# Patient Record
Sex: Male | Born: 1995 | Race: White | Hispanic: No | Marital: Single | Smoking: Never smoker
Health system: Southern US, Community
[De-identification: ages and names within clinical notes are randomized; demographics above are authoritative.]

## PROBLEM LIST (undated history)

## (undated) HISTORY — PX: TONSILECTOMY, ADENOIDECTOMY, BILATERAL MYRINGOTOMY AND TUBES: SHX2538

---
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
## 2013-09-03 ENCOUNTER — Other Ambulatory Visit: Payer: Self-pay | Admitting: Gastroenterology

## 2013-09-04 LAB — WBCS, STOOL

## 2013-09-05 ENCOUNTER — Other Ambulatory Visit: Payer: Self-pay | Admitting: Gastroenterology

## 2013-09-05 LAB — CLOSTRIDIUM DIFFICILE(ARMC)

## 2013-09-06 LAB — WBCS, STOOL

## 2013-09-06 LAB — STOOL CULTURE

## 2013-09-08 LAB — STOOL CULTURE

## 2013-10-08 ENCOUNTER — Ambulatory Visit: Payer: Self-pay | Admitting: Gastroenterology

## 2013-10-11 LAB — PATHOLOGY REPORT

## 2014-11-09 ENCOUNTER — Other Ambulatory Visit: Payer: Self-pay | Admitting: Physician Assistant

## 2014-11-09 DIAGNOSIS — R1084 Generalized abdominal pain: Secondary | ICD-10-CM

## 2014-11-09 DIAGNOSIS — R197 Diarrhea, unspecified: Secondary | ICD-10-CM

## 2014-11-17 ENCOUNTER — Ambulatory Visit
Admission: RE | Admit: 2014-11-17 | Discharge: 2014-11-17 | Disposition: A | Discharge status: Home / Self Care | Payer: 59 | Source: Ambulatory Visit | Attending: Physician Assistant | Admitting: Physician Assistant

## 2014-11-17 DIAGNOSIS — R197 Diarrhea, unspecified: Secondary | ICD-10-CM | POA: Diagnosis present

## 2014-11-17 DIAGNOSIS — R1084 Generalized abdominal pain: Secondary | ICD-10-CM | POA: Insufficient documentation

## 2014-11-17 DIAGNOSIS — M5144 Schmorl's nodes, thoracic region: Secondary | ICD-10-CM | POA: Insufficient documentation

## 2014-11-17 MED ORDER — IOHEXOL 350 MG/ML SOLN
100.0000 mL | Freq: Once | INTRAVENOUS | Status: AC | PRN
Start: 2014-11-17 — End: 2014-11-17
  Administered 2014-11-17: 100 mL via INTRAVENOUS

## 2015-04-29 ENCOUNTER — Encounter: Payer: Self-pay | Admitting: *Deleted

## 2015-04-29 ENCOUNTER — Emergency Department
Admission: EM | Admit: 2015-04-29 | Discharge: 2015-04-29 | Disposition: A | Discharge status: Home / Self Care | Payer: 59 | Attending: Emergency Medicine | Admitting: Emergency Medicine

## 2015-04-29 DIAGNOSIS — H9203 Otalgia, bilateral: Secondary | ICD-10-CM | POA: Diagnosis not present

## 2015-04-29 DIAGNOSIS — R0981 Nasal congestion: Secondary | ICD-10-CM | POA: Diagnosis present

## 2015-04-29 DIAGNOSIS — H6123 Impacted cerumen, bilateral: Secondary | ICD-10-CM | POA: Insufficient documentation

## 2015-04-29 DIAGNOSIS — J0100 Acute maxillary sinusitis, unspecified: Secondary | ICD-10-CM | POA: Diagnosis not present

## 2015-04-29 MED ORDER — AZITHROMYCIN 250 MG PO TABS: ORAL_TABLET | ORAL | Status: DC

## 2015-04-29 NOTE — ED Notes (Signed)
Pt arrived to ED reporting nasal and chest congestion for the past 2 days. Pt reports having a hx of "clogged ear" and mother reports she is unable to see in pts ear canals at this time. Pt reports being unable to hear out of left ear. PT reports green sputum and nasal drainage for the past two days. Pt denies body aches or fevers.

## 2015-04-29 NOTE — ED Provider Notes (Addendum)
[facility] Emergency Department Provider Note ____________________________________________  Time seen: 2325  I have reviewed the triage vital signs and the nursing notes.  HISTORY  Chief Complaint  Nasal Congestion  HPI Derek Gilmore is a 20 y.o. male sensitivity ED with reports of purulent phlegm and postnasal drainage, for the last 2 days. He also reports some decreased hearing in the left ear and some mild otalgia bilaterally. This been no reported fever, chills, sweats. Also been no dizziness, nausea, vomiting.  No past medical history on file.  There are no active problems to display for this patient.   No past surgical history on file.  Current Outpatient Rx  Name  Route  Sig  Dispense  Refill  . azithromycin (ZITHROMAX Z-PAK) 250 MG tablet      Take 2 tablets (500 mg) on  Day 1,  followed by 1 tablet (250 mg) once daily on Days 2 through 5.   6 each   0    Allergies Review of patient's allergies indicates no known allergies.  No family history on file.  Social History Social History  Substance Use Topics  . Smoking status: Never Smoker   . Smokeless tobacco: Not on file  . Alcohol Use: No    Review of Systems  Constitutional: Negative for fever. Eyes: Negative for visual changes. ENT: Negative for sore throat. Sinus congestion and otalgia bilaterally. Cardiovascular: Negative for chest pain. Respiratory: Negative for shortness of breath. Gastrointestinal: Negative for abdominal pain, vomiting and diarrhea. Skin: Negative for rash. Neurological: Negative for headaches, focal weakness or numbness. ____________________________________________  PHYSICAL EXAM:  VITAL SIGNS: ED Triage Vitals  Enc Vitals Group     BP 04/29/15 2148 135/76 mmHg     Pulse Rate 04/29/15 2148 70     Resp 04/29/15 2148 16     Temp 04/29/15 2148 98.9 F (37.2 C)     Temp Source 04/29/15 2148 Oral     SpO2 04/29/15 2148 96 %     Weight 04/29/15 2148  240 lb (108.863 kg)     Height 04/29/15 2148  (1.753 m)     Head Cir --      Peak Flow --      Pain Score 04/29/15 2237 0     Pain Loc --      Pain Edu? --      Excl. in GC? --    Constitutional: Alert and oriented. Well appearing and in no distress. Head: Normocephalic and atraumatic. Positive maxillary facial sinus tenderness.      Eyes: Conjunctivae are normal. PERRL. Normal extraocular movements      Ears: Canals clear. TMs intact obscured bilaterally by copious amounts of soft cerumen nearly completely occluding the ear canals.   Nose: No congestion/rhinorrhea.   Mouth/Throat: Mucous membranes are moist.   Neck: Supple. No thyromegaly. Hematological/Lymphatic/Immunological: No cervical lymphadenopathy. Cardiovascular: Normal rate, regular rhythm.  Respiratory: Normal respiratory effort. No wheezes/rales/rhonchi. Musculoskeletal: Nontender with normal range of motion in all extremities.  Neurologic:  Normal gait without ataxia. Normal speech and language. No gross focal neurologic deficits are appreciated. Skin:  Skin is warm, dry and intact. No rash noted. Psychiatric: Mood and affect are normal. Patient exhibits appropriate insight and judgment. ____________________________________________  PROCEDURES  Ceruminosis is noted bilaterally.   Verbal consent obtained. Patient prepped and draped. Each ear canal is flushed with a 1:1 mixture of hydrogen peroxide : warm water Using a 30 cc syringe + 18 G IV cannula  Moderately-sized plug of cerumen is flushed by ear canal. TMs visualized and intact bilaterally.  Patient tolerated the procedure without complication. ____________________________________________  INITIAL IMPRESSION / ASSESSMENT AND PLAN / ED COURSE  Patient with acute otalgia bilaterally secondary to cerumen impaction. He is also being treated for an acute sinusitis. He'll dose the azithromycin as directed and follow with his primary care provider for  ongoing symptom management. ____________________________________________  FINAL CLINICAL IMPRESSION(S) / ED DIAGNOSES  Final diagnoses:  Otalgia of both ears  Cerumen impaction, bilateral  Acute maxillary sinusitis, recurrence not specified      Lissa Hoard, PA-C 04/29/15 2348  Phineas Semen, MD 04/30/15 1521  Lissa Hoard, PA-C 05/16/15 0019  Phineas Semen, MD 05/16/15 [phone]

## 2015-04-29 NOTE — Discharge Instructions (Signed)
Cerumen Impaction The structures of the external ear canal secrete a waxy substance known as cerumen. Excess cerumen can build up in the ear canal, causing a condition known as cerumen impaction. Cerumen impaction can cause ear pain and disrupt the function of the ear. The rate of cerumen production differs for each individual. In certain individuals, the configuration of the ear canal may decrease his or her ability to naturally remove cerumen. CAUSES Cerumen impaction is caused by excessive cerumen production or buildup. RISK FACTORS  Frequent use of swabs to clean ears.  Having narrow ear canals.  Having eczema.  Being dehydrated. SIGNS AND SYMPTOMS  Diminished hearing.  Ear drainage.  Ear pain.  Ear itch. TREATMENT Treatment may involve:  Over-the-counter or prescription ear drops to soften the cerumen.  Removal of cerumen by a health care provider. This may be done with:  Irrigation with warm water. This is the most common method of removal.  Ear curettes and other instruments.  Surgery. This may be done in severe cases. HOME CARE INSTRUCTIONS  Take medicines only as directed by your health care provider.  Do not insert objects into the ear with the intent of cleaning the ear. PREVENTION  Do not insert objects into the ear, even with the intent of cleaning the ear. Removing cerumen as a part of normal hygiene is not necessary, and the use of swabs in the ear canal is not recommended.  Drink enough water to keep your urine clear or pale yellow.  Control your eczema if you have it. SEEK MEDICAL CARE IF:  You develop ear pain.  You develop bleeding from the ear.  The cerumen does not clear after you use ear drops as directed.   This information is not intended to replace advice given to you by your health care provider. Make sure you discuss any questions you have with your health care provider.   Document Released: 04/04/2004 Document Revised: 03/18/2014  Document Reviewed: 10/12/2014 Elsevier Interactive Patient Education 2016 [employer]  Sinusitis, Adult Sinusitis is redness, soreness, and puffiness (inflammation) of the air pockets in the bones of your face (sinuses). The redness, soreness, and puffiness can cause air and mucus to get trapped in your sinuses. This can allow germs to grow and cause an infection.  HOME CARE   Drink enough fluids to keep your pee (urine) clear or pale yellow.  Use a humidifier in your home.  Run a hot shower to create steam in the bathroom. Sit in the bathroom with the door closed. Breathe in the steam 3-4 times a day.  Put a warm, moist washcloth on your face 3-4 times a day, or as told by your doctor.  Use salt water sprays (saline sprays) to wet the thick fluid in your nose. This can help the sinuses drain.  Only take medicine as told by your doctor. GET HELP RIGHT AWAY IF:   Your pain gets worse.  You have very bad headaches.  You are sick to your stomach (nauseous).  You throw up (vomit).  You are very sleepy (drowsy) all the time.  Your face is puffy (swollen).  Your vision changes.  You have a stiff neck.  You have trouble breathing. MAKE SURE YOU:   Understand these instructions.  Will watch your condition.  Will get help right away if you are not doing well or get worse.   This information is not intended to replace advice given to you by your health care provider. Make sure you  discuss any questions you have with your health care provider.   Document Released: 08/14/2007 Document Revised: 03/18/2014 Document Reviewed: 10/01/2011 Elsevier Interactive Patient Education [employer]  Take the antibiotic as directed. Follow-up with Dr. Judithann Sheen as needed.

## 2015-04-29 NOTE — ED Notes (Addendum)
Pt reports congestion, cough (green phlegm), green nasal drainage for the last 2 days - Pt reports hard to hear out of left ear - Denies fever - Denies body aches or chills - Denies vomiting or diarrhea

## 2015-06-01 ENCOUNTER — Encounter: Payer: Self-pay | Admitting: Emergency Medicine

## 2015-06-01 ENCOUNTER — Emergency Department
Admission: EM | Admit: 2015-06-01 | Discharge: 2015-06-01 | Disposition: A | Discharge status: Home / Self Care | Payer: 59 | Attending: Emergency Medicine | Admitting: Emergency Medicine

## 2015-06-01 DIAGNOSIS — T1501XA Foreign body in cornea, right eye, initial encounter: Secondary | ICD-10-CM | POA: Diagnosis not present

## 2015-06-01 DIAGNOSIS — H209 Unspecified iridocyclitis: Secondary | ICD-10-CM | POA: Insufficient documentation

## 2015-06-01 DIAGNOSIS — H5711 Ocular pain, right eye: Secondary | ICD-10-CM | POA: Diagnosis present

## 2015-06-01 MED ORDER — FLUORESCEIN SODIUM 1 MG OP STRP
ORAL_STRIP | OPHTHALMIC | Status: AC
  Filled 2015-06-01: qty 1

## 2015-06-01 NOTE — Discharge Instructions (Signed)
Uveitis °Uveitis is swelling and irritation (inflammation) in the eye. It often affects the middle part of the eye (uvea). This area contains many of the blood vessels that supply the rest of the eye. The uvea is made up of three structures: °· The middle layer of the eyeball (choroid). °· The colored part of the front of the eye (iris). °· The connection between the iris and the choroid (ciliary body). °Uveitis can affect any part of the uvea as well as other important structures in the eye. There are many types of uveitis: °· Iritis, or anterior uveitis, affects the iris. This is the most common type. It can start suddenly and can last for many weeks. °· Intermediate uveitis affects the structures in the middle of the eye. This includes the fluid that fills the eye (vitreous). This type can last for years and can come and go. °· Posterior uveitis affects the structures in the back of the eye. This includes the light-sensitive layer of cells that is needed for vision (retina). This is the least common type. °· Panuveitis affects all layers of the eye. °Uveitis can affect one eye or both eyes. It can cause short-term or long-term symptoms. Symptoms may go away and come back. Over time, the condition can damage or destroy eye structures and can lead to vision loss. °CAUSES °This condition may be caused by: °· Infections that start in the eye or spread to the eye. °· Inflammatory diseases that can affect the eye. °· Autoimmune diseases. These are diseases in which the body's defense system (immune system) mistakenly attacks the body's own tissues. °· Eye injuries. °In some cases, the cause may not be known. °RISK FACTORS °This condition is more likely to develop in people who are 20-60 years old. °SYMPTOMS °Symptoms of this condition depend on the type of uveitis. Common symptoms include: °· Eye redness. °· Eye pain. °· Blurred vision. °· Decreased vision. °· Floating dark spots in your vision  (floaters). °· Sensitivity to light. °· A white spot in the lower part of the iris (hypopyon). °DIAGNOSIS °This condition is usually diagnosed by an eye specialist (ophthalmologist). The ophthalmologist will do a complete eye exam. This exam may include: °· A vision test using eye charts. °· An exam that involves using a scope for viewing inside the eye (ophthalmoscope or slit lamp). Eye drops may be used to widen (dilate) your pupil to make it easier to see inside your eye. °· A test to measure eye pressure. °You may have other medical tests to help determine the cause of your uveitis. °TREATMENT °Treatment for this condition may depend on the type of uveitis that you have. It should be started right away to help prevent vision loss. Treatment may include: °· Medicine to block inflammation (steroids). You may get steroids: °¨ As eye drops. °¨ As an injection into your eye. °¨ By mouth. °· Eye drops to dilate the pupil and reduce pressure inside the eye. °· In some cases, medicines may be given through an IV tube or through a device that is implanted inside the eye. °HOME CARE INSTRUCTIONS °· Take medicines only as directed by your health care provider. Use eye drops exactly as directed. °· Follow instructions from your health care provider about any restrictions on your activities. Ask what activities are safe for you. °· Do not use any tobacco products, including cigarettes, chewing tobacco, or electronic cigarettes. If you need help quitting, ask your health care provider. °· Keep all follow-up visits   as directed by your health care provider. This is important. °SEEK MEDICAL CARE IF: °· Your medicines are not working. °SEEK IMMEDIATE MEDICAL CARE IF: °· You have redness in one eye or both eyes. °· Your eyes are very sensitive to light. °· You have pain or aching in either eye. °· You have vision loss in either eye. °  °This information is not intended to replace advice given to you by your health care provider.  Make sure you discuss any questions you have with your health care provider. °  °Document Released: 05/24/2008 Document Revised: 07/12/2014 Document Reviewed: 03/02/2014 °Elsevier Interactive Patient Education ©2016 Elsevier Inc. ° °

## 2015-06-01 NOTE — ED Notes (Signed)
See triage  States he was painting with a sprayer and it had gotten in face.  eye is red and sl swollen

## 2015-06-01 NOTE — ED Notes (Addendum)
Pt to ed with c/o pain in right eye that started 2 days ago.  Eye is red and swollen this am. Pt states about 2 days ago he was painting and the paint sprayed in his face and eyes.

## 2015-06-01 NOTE — ED Provider Notes (Signed)
[facility] Emergency Department Provider Note  ____________________________________________    I have reviewed the triage vital signs and the nursing notes.   HISTORY  Chief Complaint Eye Pain    HPI Derek Gilmore is a 20 y.o. male who presents with complaints of right eye pain. Patient reports 2 days ago he was working with a paint gun which malfunctioned and showered him with paint all over the side of his body. He reports none got in his eye however, he reports he did not have any eye pain at the time. Today he reports his eye is red and tearing and painful especially to light.     History reviewed. No pertinent past medical history.  There are no active problems to display for this patient.   History reviewed. No pertinent past surgical history.  Current Outpatient Rx  Name  Route  Sig  Dispense  Refill  . azithromycin (ZITHROMAX Z-PAK) 250 MG tablet      Take 2 tablets (500 mg) on  Day 1,  followed by 1 tablet (250 mg) once daily on Days 2 through 5.   6 each   0     Allergies Review of patient's allergies indicates no known allergies.  History reviewed. No pertinent family history.  Social History Social History  Substance Use Topics  . Smoking status: Never Smoker   . Smokeless tobacco: None  . Alcohol Use: No    Review of Systems  Constitutional: Negative for fever. Eyes: Positive for redness and sensitivity to light ENT: Negative for sore throat Skin: Negative for rash.  Psychiatric: no anxiety    ____________________________________________   PHYSICAL EXAM:  VITAL SIGNS: ED Triage Vitals  Enc Vitals Group     BP 06/01/15 1242 152/77 mmHg     Pulse Rate 06/01/15 1242 100     Resp 06/01/15 1242 18     Temp 06/01/15 1242 98.3 F (36.8 C)     Temp Source 06/01/15 1242 Oral     SpO2 06/01/15 1242 100 %     Weight 06/01/15 1242 250 lb (113.399 kg)     Height 06/01/15 1242  (1.702 m)     Head Cir --    Peak Flow --      Pain Score 06/01/15 1251 5     Pain Loc --      Pain Edu? --      Excl. in GC? --      Constitutional: Alert and oriented. Well appearing and in no distress.  Eyes: Conjunctivae are injected. Patient develops pain when pen light shined in the affected eye and the unaffected eye. Does appear to be red rim surrounding the iris. No purulent discharge. Mild erythema of the orbit possibly related to patient rubbing his eye, no hyphema.Pinpoint area of fluorescein uptake at 6 PM right eye  ENT   Head: Normocephalic and atraumatic.   Mouth/Throat: Mucous membranes are moist.  Neurologic:  Normal speech and language. No gross focal neurologic deficits are appreciated. Skin:  Skin is warm, dry and intact. No rash noted. Psychiatric: Mood and affect are normal. Patient exhibits appropriate insight and judgment.  ____________________________________________    LABS (pertinent positives/negatives)  Labs Reviewed - No data to display  ____________________________________________   EKG  None  ____________________________________________    RADIOLOGY  None  ____________________________________________   PROCEDURES  Procedure(s) performed: none  Critical Care performed: none  ____________________________________________   INITIAL IMPRESSION / ASSESSMENT AND PLAN / ED COURSE  Pertinent labs & imaging results that were available during my care of the patient were reviewed by me and considered in my medical decision making (see chart for details).  Concerning for iritis. I will discuss with ophthalmology   Dr. Georgianne FickVin will see the patient in the office in 20 minutes ____________________________________________   FINAL CLINICAL IMPRESSION(S) / ED DIAGNOSES  Final diagnoses:  Iritis          Jene Everyobert Ardie Dragoo, MD 06/01/15 1432

## 2015-06-02 DIAGNOSIS — T1501XD Foreign body in cornea, right eye, subsequent encounter: Secondary | ICD-10-CM | POA: Diagnosis not present

## 2015-12-04 ENCOUNTER — Encounter: Payer: Self-pay | Admitting: Emergency Medicine

## 2015-12-04 ENCOUNTER — Emergency Department
Admission: EM | Admit: 2015-12-04 | Discharge: 2015-12-05 | Disposition: A | Discharge status: Home / Self Care | Payer: 59 | Attending: Emergency Medicine | Admitting: Emergency Medicine

## 2015-12-04 DIAGNOSIS — M5412 Radiculopathy, cervical region: Secondary | ICD-10-CM | POA: Diagnosis not present

## 2015-12-04 DIAGNOSIS — Z792 Long term (current) use of antibiotics: Secondary | ICD-10-CM | POA: Diagnosis not present

## 2015-12-04 DIAGNOSIS — M5442 Lumbago with sciatica, left side: Secondary | ICD-10-CM | POA: Diagnosis not present

## 2015-12-04 DIAGNOSIS — M5432 Sciatica, left side: Secondary | ICD-10-CM

## 2015-12-04 DIAGNOSIS — M545 Low back pain, unspecified: Secondary | ICD-10-CM

## 2015-12-04 DIAGNOSIS — M9903 Segmental and somatic dysfunction of lumbar region: Secondary | ICD-10-CM | POA: Diagnosis not present

## 2015-12-04 DIAGNOSIS — M542 Cervicalgia: Secondary | ICD-10-CM | POA: Diagnosis present

## 2015-12-04 NOTE — ED Triage Notes (Signed)
Patient ambulatory to triage with steady gait, without difficulty or distress noted; pt reports had back adjusted at 3pm and now with pain to lower back and lower legs with HA to "temples"

## 2015-12-05 ENCOUNTER — Emergency Department: Payer: 59

## 2015-12-05 DIAGNOSIS — M5412 Radiculopathy, cervical region: Secondary | ICD-10-CM | POA: Diagnosis not present

## 2015-12-05 DIAGNOSIS — Z792 Long term (current) use of antibiotics: Secondary | ICD-10-CM | POA: Diagnosis not present

## 2015-12-05 DIAGNOSIS — M542 Cervicalgia: Secondary | ICD-10-CM | POA: Diagnosis not present

## 2015-12-05 DIAGNOSIS — M5432 Sciatica, left side: Secondary | ICD-10-CM | POA: Diagnosis not present

## 2015-12-05 DIAGNOSIS — M545 Low back pain: Secondary | ICD-10-CM | POA: Diagnosis not present

## 2015-12-05 MED ORDER — CYCLOBENZAPRINE HCL 10 MG PO TABS
10.0000 mg | ORAL_TABLET | Freq: Once | ORAL | Status: AC
  Administered 2015-12-05: 10 mg via ORAL
  Filled 2015-12-05: qty 1

## 2015-12-05 MED ORDER — CYCLOBENZAPRINE HCL 10 MG PO TABS: 10.0000 mg | ORAL_TABLET | Freq: Three times a day (TID) | ORAL | 0 refills | Status: DC | PRN

## 2015-12-05 NOTE — ED Notes (Signed)
Pt asleep in bed, call light in reach

## 2015-12-07 DIAGNOSIS — S134XXA Sprain of ligaments of cervical spine, initial encounter: Secondary | ICD-10-CM | POA: Diagnosis not present

## 2015-12-07 DIAGNOSIS — M5442 Lumbago with sciatica, left side: Secondary | ICD-10-CM | POA: Diagnosis not present

## 2015-12-08 NOTE — ED Provider Notes (Signed)
[facility] Emergency Department Provider Note    First MD Initiated Contact with Patient 12/05/15 0100     (approximate)  I have reviewed the triage vital signs and the nursing notes.   HISTORY  Chief Complaint Back Pain   HPI Derek Gilmore is a 20 y.o. male presents with low neck and low back pain following chiropractic manipulation at 3 PM yesterday afternoon. Patient states that the pain in his back radiates down the posterior portion of his left leg Patient also admits to headache to the bilateral "temples". Patient denies any weakness no numbness or gait instability. Patient denies any nausea or vomiting. Patient denies any visual changes   Past medical history "Back problems" There are no active problems to display for this patient.   Past surgical history None  Prior to Admission medications   Medication Sig Start Date End Date Taking? Authorizing Provider  azithromycin (ZITHROMAX Z-PAK) 250 MG tablet Take 2 tablets (500 mg) on  Day 1,  followed by 1 tablet (250 mg) once daily on Days 2 through 5. 04/29/15   Jenise V Bacon Menshew, PA-C  cyclobenzaprine (FLEXERIL) 10 MG tablet Take 1 tablet (10 mg total) by mouth 3 (three) times daily as needed. 12/05/15   Darci Currentandolph N Brown, MD    Allergies Review of patient's allergies indicates no known allergies.  No family history on file.  Social History Social History  Substance Use Topics  . Smoking status: Never Smoker  . Smokeless tobacco: Never Used  . Alcohol use No    Review of Systems Constitutional: No fever/chills Eyes: No visual changes. ENT: No sore throat. Cardiovascular: Denies chest pain. Respiratory: Denies shortness of breath. Gastrointestinal: No abdominal pain.  No nausea, no vomiting.  No diarrhea.  No constipation. Genitourinary: Negative for dysuria. Musculoskeletal:Positive for neck and back pain. Skin: Negative for rash. Neurological: Negative for headaches, focal  weakness or numbness.  10-point ROS otherwise negative.  ____________________________________________   PHYSICAL EXAM:  VITAL SIGNS: ED Triage Vitals  Enc Vitals Group     BP 12/04/15 2257 (!) 157/89     Pulse Rate 12/04/15 2257 85     Resp 12/04/15 2257 18     Temp 12/04/15 2257 98 F (36.7 C)     Temp Source 12/04/15 2257 Oral     SpO2 12/04/15 2257 99 %     Weight 12/04/15 2258 270 lb (122.5 kg)     Height 12/04/15 2258 5\' 9"  (1.753 m)     Head Circumference --      Peak Flow --      Pain Score 12/04/15 2258 5     Pain Loc --      Pain Edu? --      Excl. in GC? --     Constitutional: Alert and oriented. Well appearing and in no acute distress. Eyes: Conjunctivae are normal. PERRL. EOMI. Head: Atraumatic. Ears:  Healthy appearing ear canals and TMs bilaterally Nose: No congestion/rhinnorhea. Mouth/Throat: Mucous membranes are moist.  Oropharynx non-erythematous. Neck: No stridor.  No meningeal signs.  Pain with palpation C5-C7 Cardiovascular: Normal rate, regular rhythm. Good peripheral circulation. Grossly normal heart sounds. Respiratory: Normal respiratory effort.  No retractions. Lungs CTAB. Gastrointestinal: Soft and nontender. No distention.  Musculoskeletal: No lower extremity tenderness nor edema. No gross deformities of extremities. Pain with palpation L3-L5 Neurologic:  Normal speech and language. No gross focal neurologic deficits are appreciated.  Skin:  Skin is warm, dry and intact. No rash  noted. Psychiatric: Mood and affect are normal. Speech and behavior are normal.   RADIOLOGY I, Thatcher N BROWN, personally viewed and evaluated these images (plain radiographs) as part of my medical decision making, as well as reviewing the written report by the radiologist.  CLINICAL DATA:  Neck and back pain after adjustment this afternoon. Headache radiating to the temples.  EXAM: MRI CERVICAL AND LUMBAR SPINE WITHOUT CONTRAST  TECHNIQUE: Multiplanar and  multiecho pulse sequences of the cervical spine, to include the craniocervical junction and cervicothoracic junction, and lumbar spine, were obtained without intravenous contrast.  COMPARISON:  CT abdomen and pelvis November 17, 2014  FINDINGS: MRI CERVICAL SPINE FINDINGS- Mild motion degraded examination.  ALIGNMENT: Straightened cervical lordosis.  No malalignment.  VERTEBRAE/DISCS: Vertebral bodies are intact. Intervertebral disc morphology's and signal are normal.  CORD:Cervical spinal cord is normal morphology and signal characteristics from the cervicomedullary junction to level of T1-2, the most caudal well visualized level.  POSTERIOR FOSSA, VERTEBRAL ARTERIES, PARASPINAL TISSUES: No MR findings of ligamentous injury. Vertebral artery flow voids present. Included posterior fossa and paraspinal soft tissues are normal.  DISC LEVELS:  C2-3 through C5-6: No disc bulge, canal stenosis nor neural foraminal narrowing.  C6-7: Tiny central disc protrusion and annular fissure. No canal stenosis or neural foraminal narrowing.  C7-T1: No disc bulge, canal stenosis nor neural foraminal narrowing.  MRI LUMBAR SPINE FINDINGS  SEGMENTATION: For the purposes of this report, the last well-formed intervertebral disc will be described as L5-S1.  ALIGNMENT: No malalignment.  Maintenance of the lumbar lordosis.  VERTEBRAE:Lumbar vertebral bodies are intact. Bilateral chronic L5 pars interarticularis defects. Intervertebral discs demonstrate normal morphology. Decreased T2 signal within the L5-S1 disc compatible with mild desiccation. Mild acute discogenic endplate changes on the LEFT at L5-S1.  CONUS MEDULLARIS: Conus medullaris terminates at L1-2 and demonstrates normal morphology and signal characteristics. Cauda equina is normal.  PARASPINAL AND SOFT TISSUES: Included prevertebral and paraspinal soft tissues are normal.  DISC LEVELS:  T12-L1 through L4-5:  No disc bulge, canal stenosis nor neural foraminal narrowing.  L5-S1: Tiny LEFT subarticular disc protrusion versus extrusion, difficult to characterize on axial sequences due to small size. No canal stenosis. Mild to moderate LEFT neural foraminal narrowing. The exited LEFT L5 nerve appears somewhat bulky and T2 bright.  IMPRESSION: MRI CERVICAL SPINE: Straightened cervical lordosis without acute fracture or malalignment.  Tiny central disc protrusion and annular fissure C6-7. No canal stenosis or neural foraminal narrowing.  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  Small LEFT L5-S1 subarticular disc protrusion versus extrusion and suspected LEFT L5 neuritis.  No canal stenosis. Mild to moderate LEFT L5-S1 neural foraminal narrowing.   Electronically Signed   By: Awilda Metro M.D.   On: 12/05/2015 02:33  Procedures      INITIAL IMPRESSION / ASSESSMENT AND PLAN / ED COURSE  Pertinent labs & imaging results that were available during my care of the patient were reviewed by me and considered in my medical decision making (see chart for details).  History physical exam consistent with cervical radiculopathy possible sciatica as such patient given Flexeril emergency department will be prescribed same for home. Patient referred to orthopedic surgeon   Clinical Course    ____________________________________________  FINAL CLINICAL IMPRESSION(S) / ED DIAGNOSES  Final diagnoses:  Lumbar spine pain  Cervical radiculopathy  Sciatica of left side     MEDICATIONS GIVEN DURING THIS VISIT:  Medications  cyclobenzaprine (FLEXERIL) tablet 10  mg (10 mg Oral Given 12/05/15 0313)     NEW OUTPATIENT MEDICATIONS STARTED DURING THIS VISIT:  Discharge Medication List as of 12/05/2015  3:21 AM    START taking these medications   Details  cyclobenzaprine (FLEXERIL) 10 MG tablet  Take 1 tablet (10 mg total) by mouth 3 (three) times daily as needed., Starting Tue 12/05/2015, Print        Discharge Medication List as of 12/05/2015  3:21 AM      Discharge Medication List as of 12/05/2015  3:21 AM       Note:  This document was prepared using Dragon voice recognition software and may include unintentional dictation errors.    Darci Current, MD 12/08/15 [phone]

## 2016-03-18 DIAGNOSIS — Z13220 Encounter for screening for lipoid disorders: Secondary | ICD-10-CM | POA: Diagnosis not present

## 2016-03-18 DIAGNOSIS — Z79899 Other long term (current) drug therapy: Secondary | ICD-10-CM | POA: Diagnosis not present

## 2016-03-18 DIAGNOSIS — Z1329 Encounter for screening for other suspected endocrine disorder: Secondary | ICD-10-CM | POA: Diagnosis not present

## 2016-03-18 DIAGNOSIS — Z0000 Encounter for general adult medical examination without abnormal findings: Secondary | ICD-10-CM | POA: Diagnosis not present

## 2016-03-18 DIAGNOSIS — R030 Elevated blood-pressure reading, without diagnosis of hypertension: Secondary | ICD-10-CM | POA: Diagnosis not present

## 2016-07-25 DIAGNOSIS — Z6841 Body Mass Index (BMI) 40.0 and over, adult: Secondary | ICD-10-CM | POA: Diagnosis not present

## 2016-07-25 DIAGNOSIS — J4520 Mild intermittent asthma, uncomplicated: Secondary | ICD-10-CM | POA: Diagnosis not present

## 2017-01-08 DIAGNOSIS — R1084 Generalized abdominal pain: Secondary | ICD-10-CM | POA: Diagnosis not present

## 2017-01-08 DIAGNOSIS — R252 Cramp and spasm: Secondary | ICD-10-CM | POA: Diagnosis not present

## 2017-01-09 ENCOUNTER — Other Ambulatory Visit: Payer: Self-pay | Admitting: Internal Medicine

## 2017-01-09 DIAGNOSIS — R1084 Generalized abdominal pain: Secondary | ICD-10-CM

## 2017-12-12 DIAGNOSIS — E7800 Pure hypercholesterolemia, unspecified: Secondary | ICD-10-CM | POA: Diagnosis not present

## 2017-12-12 DIAGNOSIS — Z0000 Encounter for general adult medical examination without abnormal findings: Secondary | ICD-10-CM | POA: Diagnosis not present

## 2018-01-16 DIAGNOSIS — E7800 Pure hypercholesterolemia, unspecified: Secondary | ICD-10-CM | POA: Diagnosis not present

## 2018-01-16 DIAGNOSIS — Z0000 Encounter for general adult medical examination without abnormal findings: Secondary | ICD-10-CM | POA: Diagnosis not present

## 2018-08-22 ENCOUNTER — Emergency Department: Payer: 59

## 2018-08-22 ENCOUNTER — Emergency Department
Admission: EM | Admit: 2018-08-22 | Discharge: 2018-08-22 | Disposition: A | Discharge status: Home / Self Care | Payer: 59 | Attending: Emergency Medicine | Admitting: Emergency Medicine

## 2018-08-22 ENCOUNTER — Other Ambulatory Visit: Payer: Self-pay

## 2018-08-22 ENCOUNTER — Encounter: Payer: Self-pay | Admitting: Emergency Medicine

## 2018-08-22 DIAGNOSIS — Y93I9 Activity, other involving external motion: Secondary | ICD-10-CM | POA: Diagnosis not present

## 2018-08-22 DIAGNOSIS — S30811A Abrasion of abdominal wall, initial encounter: Secondary | ICD-10-CM | POA: Insufficient documentation

## 2018-08-22 DIAGNOSIS — Y998 Other external cause status: Secondary | ICD-10-CM | POA: Insufficient documentation

## 2018-08-22 DIAGNOSIS — S40812A Abrasion of left upper arm, initial encounter: Secondary | ICD-10-CM | POA: Diagnosis not present

## 2018-08-22 DIAGNOSIS — Y9289 Other specified places as the place of occurrence of the external cause: Secondary | ICD-10-CM | POA: Diagnosis not present

## 2018-08-22 DIAGNOSIS — S60221A Contusion of right hand, initial encounter: Secondary | ICD-10-CM | POA: Insufficient documentation

## 2018-08-22 DIAGNOSIS — S40811A Abrasion of right upper arm, initial encounter: Secondary | ICD-10-CM | POA: Diagnosis not present

## 2018-08-22 DIAGNOSIS — S60512A Abrasion of left hand, initial encounter: Secondary | ICD-10-CM | POA: Insufficient documentation

## 2018-08-22 DIAGNOSIS — S60511A Abrasion of right hand, initial encounter: Secondary | ICD-10-CM | POA: Diagnosis not present

## 2018-08-22 DIAGNOSIS — T07XXXA Unspecified multiple injuries, initial encounter: Secondary | ICD-10-CM

## 2018-08-22 MED ORDER — SILVER SULFADIAZINE 1 % EX CREA: TOPICAL_CREAM | CUTANEOUS | 1 refills | Status: DC

## 2018-08-22 MED ORDER — HYDROCODONE-ACETAMINOPHEN 5-325 MG PO TABS: 1.0000 | ORAL_TABLET | Freq: Four times a day (QID) | ORAL | 0 refills | Status: DC | PRN

## 2018-08-22 MED ORDER — TETANUS-DIPHTH-ACELL PERTUSSIS 5-2.5-18.5 LF-MCG/0.5 IM SUSP
0.5000 mL | Freq: Once | INTRAMUSCULAR | Status: AC
  Administered 2018-08-22: 0.5 mL via INTRAMUSCULAR
  Filled 2018-08-22: qty 0.5

## 2018-08-22 MED ORDER — SILVER SULFADIAZINE 1 % EX CREA
TOPICAL_CREAM | Freq: Once | CUTANEOUS | Status: AC
  Administered 2018-08-22: 15:00:00 via TOPICAL
  Filled 2018-08-22: qty 85

## 2018-08-22 MED ORDER — CEPHALEXIN 500 MG PO CAPS: 500.0000 mg | ORAL_CAPSULE | Freq: Three times a day (TID) | ORAL | 0 refills | Status: DC
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
## 2018-12-14 ENCOUNTER — Ambulatory Visit: Payer: Self-pay

## 2018-12-14 ENCOUNTER — Other Ambulatory Visit: Payer: Self-pay

## 2018-12-14 VITALS — BP 140/82 | HR 76 | Resp 18 | Ht 69.0 in | Wt 278.0 lb

## 2018-12-14 DIAGNOSIS — Z008 Encounter for other general examination: Secondary | ICD-10-CM

## 2018-12-14 LAB — POCT LIPID PANEL
HDL: 41
LDL: 61
Non-HDL: 109
POC Glucose: 99 mg/dl (ref 70–99)
TC/HDL: 3.6
TC: 150
TRG: 237

## 2018-12-14 NOTE — Progress Notes (Signed)
     Patient ID: Derek Gilmore, male    DOB: Aug 21, 1995, 23 y.o.   MRN: [id number]    Thank you!!  Apolonio Schneiders RN  [place] Nurse Specialist [place]: [phone]  Cell:  [phone] Website: [email or web address]

## 2019-01-07 DIAGNOSIS — K589 Irritable bowel syndrome without diarrhea: Secondary | ICD-10-CM | POA: Diagnosis not present

## 2019-01-07 DIAGNOSIS — Z0000 Encounter for general adult medical examination without abnormal findings: Secondary | ICD-10-CM | POA: Diagnosis not present

## 2019-01-07 DIAGNOSIS — E7800 Pure hypercholesterolemia, unspecified: Secondary | ICD-10-CM | POA: Diagnosis not present

## 2019-01-07 DIAGNOSIS — Z6841 Body Mass Index (BMI) 40.0 and over, adult: Secondary | ICD-10-CM | POA: Insufficient documentation

## 2019-01-07 DIAGNOSIS — J4520 Mild intermittent asthma, uncomplicated: Secondary | ICD-10-CM | POA: Diagnosis not present

## 2019-02-01 ENCOUNTER — Emergency Department: Payer: 59

## 2019-02-01 ENCOUNTER — Encounter: Payer: Self-pay | Admitting: Emergency Medicine

## 2019-02-01 ENCOUNTER — Other Ambulatory Visit: Payer: Self-pay

## 2019-02-01 ENCOUNTER — Emergency Department
Admission: EM | Admit: 2019-02-01 | Discharge: 2019-02-01 | Disposition: A | Discharge status: Home / Self Care | Payer: 59 | Attending: Emergency Medicine | Admitting: Emergency Medicine

## 2019-02-01 DIAGNOSIS — Z20828 Contact with and (suspected) exposure to other viral communicable diseases: Secondary | ICD-10-CM | POA: Insufficient documentation

## 2019-02-01 DIAGNOSIS — R05 Cough: Secondary | ICD-10-CM | POA: Insufficient documentation

## 2019-02-01 DIAGNOSIS — R059 Cough, unspecified: Secondary | ICD-10-CM

## 2019-02-01 LAB — SARS CORONAVIRUS 2 (TAT 6-24 HRS): SARS Coronavirus 2: NEGATIVE

## 2019-02-01 MED ORDER — BENZONATATE 100 MG PO CAPS: 200.0000 mg | ORAL_CAPSULE | Freq: Three times a day (TID) | ORAL | 0 refills | Status: DC | PRN

## 2019-02-01 NOTE — ED Triage Notes (Signed)
Pt reports cough all weekend, went to work and they told him to come here. Denies fevers, or other sx's.

## 2019-02-01 NOTE — ED Provider Notes (Signed)
[facility] Emergency Department Provider Note   ____________________________________________   First MD Initiated Contact with Patient 02/01/19 1158     (approximate)  I have reviewed the triage vital signs and the nursing notes.   HISTORY  Chief Complaint Cough    HPI Derek Gilmore is a 23 y.o. male patient presents to ED with 3 days of nonproductive cough.  Patient went to work today and was told to be cleared by the ED.  Patient is currently going to school and working part-time of [employer].  Patient denies fevers/chills.  Patient denies any known exposure to COVID-19.  No recent travel.         History reviewed. No pertinent past medical history.  There are no active problems to display for this patient.   History reviewed. No pertinent surgical history.  Prior to Admission medications   Medication Sig Start Date End Date Taking? Authorizing Provider  benzonatate (TESSALON PERLES) 100 MG capsule Take 2 capsules (200 mg total) by mouth 3 (three) times daily as needed. 02/01/19 02/01/20  Joni Reining, PA-C  cephALEXin (KEFLEX) 500 MG capsule Take 1 capsule (500 mg total) by mouth 3 (three) times daily. 08/22/18   Tommi Rumps, PA-C  HYDROcodone-acetaminophen (NORCO/VICODIN) 5-325 MG tablet Take 1 tablet by mouth every 6 (six) hours as needed for moderate pain. 08/22/18   Tommi Rumps, PA-C  silver sulfADIAZINE (SILVADENE) 1 % cream Apply thin layer to affected areas daily 08/22/18 08/22/19  Tommi Rumps, PA-C    Allergies Patient has no known allergies.  History reviewed. No pertinent family history.  Social History Social History   Tobacco Use  . Smoking status: Never Smoker  . Smokeless tobacco: Never Used  Substance Use Topics  . Alcohol use: No  . Drug use: No    Review of Systems Constitutional: No fever/chills Eyes: No visual changes. ENT: No sore throat. Cardiovascular: Denies chest pain. Respiratory: Denies  shortness of breath.  Nonproductive cough. Gastrointestinal: No abdominal pain.  No nausea, no vomiting.  No diarrhea.  No constipation. Genitourinary: Negative for dysuria. Musculoskeletal: Negative for back pain. Skin: Negative for rash. Neurological: Negative for headaches, focal weakness or numbness.   ____________________________________________   PHYSICAL EXAM:  VITAL SIGNS: ED Triage Vitals  Enc Vitals Group     BP 02/01/19 1031 132/83     Pulse Rate 02/01/19 1031 67     Resp 02/01/19 1031 16     Temp 02/01/19 1031 98.4 F (36.9 C)     Temp Source 02/01/19 1031 Oral     SpO2 02/01/19 1031 100 %     Weight 02/01/19 1033 280 lb (127 kg)     Height 02/01/19 1033 5\' 11"  (1.803 m)     Head Circumference --      Peak Flow --      Pain Score 02/01/19 1033 0     Pain Loc --      Pain Edu? --      Excl. in GC? --     Constitutional: Alert and oriented. Well appearing and in no acute distress. Nose: No congestion/rhinnorhea. Mouth/Throat: Mucous membranes are moist.  Oropharynx non-erythematous. Neck: No stridor.   Hematological/Lymphatic/Immunilogical: No cervical lymphadenopathy. Cardiovascular: Normal rate, regular rhythm. Grossly normal heart sounds.  Good peripheral circulation. Respiratory: Normal respiratory effort.  No retractions. Lungs CTAB. Musculoskeletal: No lower extremity tenderness nor edema.  No joint effusions. Neurologic:  Normal speech and language. No gross focal neurologic deficits  are appreciated. No gait instability. Skin:  Skin is warm, dry and intact. No rash noted. Psychiatric: Mood and affect are normal. Speech and behavior are normal.  ____________________________________________   LABS (all labs ordered are listed, but only abnormal results are displayed)  Labs Reviewed  SARS CORONAVIRUS 2 (TAT 6-24 HRS)   ____________________________________________  EKG   ____________________________________________  RADIOLOGY  ED MD  interpretation:    Official radiology report(s): Dg Chest Portable 1 View  Result Date: 02/01/2019 CLINICAL DATA:  23 year old male with cough. EXAM: PORTABLE CHEST 1 VIEW COMPARISON:  Chest radiograph dated 10/03/2004. FINDINGS: The lungs are clear. There is no pleural effusion or pneumothorax. The cardiac silhouette is within normal limits. No acute osseous pathology. IMPRESSION: No active disease. Electronically Signed   By: Anner Crete M.D.   On: 02/01/2019 12:38    ____________________________________________   PROCEDURES  Procedure(s) performed (including Critical Care):  Procedures   ____________________________________________   INITIAL IMPRESSION / ASSESSMENT AND PLAN / ED COURSE  As part of my medical decision making, I reviewed the following data within the [place]     Patient presents with nonproductive cough for 3 days.  Patient advised he will be notified telephonically his COVID-19 test is positive.  His exam consistent for cough.  Patient given discharge care instruction advised self quarantine pending results of COVID-19 test.    MCCOY TESTA was evaluated in Emergency Department on 02/01/2019 for the symptoms described in the history of present illness. He was evaluated in the context of the global COVID-19 pandemic, which necessitated consideration that the patient might be at risk for infection with the SARS-CoV-2 virus that causes COVID-19. Institutional protocols and algorithms that pertain to the evaluation of patients at risk for COVID-19 are in a state of rapid change based on information released by regulatory bodies including the CDC and federal and state organizations. These policies and algorithms were followed during the patient's care in the ED.       ____________________________________________   FINAL CLINICAL IMPRESSION(S) / ED DIAGNOSES  Final diagnoses:  Cough     ED Discharge Orders         Ordered     benzonatate (TESSALON PERLES) 100 MG capsule  3 times daily PRN     02/01/19 1252           Note:  This document was prepared using Dragon voice recognition software and may include unintentional dictation errors.    Sable Feil, PA-C 02/01/19 1257    Blake Divine, MD 02/01/19 [phone]

## 2019-02-01 NOTE — ED Notes (Signed)

## 2019-02-08 ENCOUNTER — Emergency Department
Admission: EM | Admit: 2019-02-08 | Discharge: 2019-02-08 | Disposition: A | Discharge status: Home / Self Care | Payer: 59 | Attending: Emergency Medicine | Admitting: Emergency Medicine

## 2019-02-08 ENCOUNTER — Other Ambulatory Visit: Payer: Self-pay

## 2019-02-08 ENCOUNTER — Encounter: Payer: Self-pay | Admitting: Emergency Medicine

## 2019-02-08 DIAGNOSIS — Z0000 Encounter for general adult medical examination without abnormal findings: Secondary | ICD-10-CM

## 2019-02-08 DIAGNOSIS — Z20828 Contact with and (suspected) exposure to other viral communicable diseases: Secondary | ICD-10-CM | POA: Diagnosis not present

## 2019-02-08 NOTE — ED Notes (Signed)
See triage note  Presents for a note to go back to work  Had a COVID last week and it was negative

## 2019-02-08 NOTE — ED Triage Notes (Signed)
Says he needs a release to go back to work.

## 2019-02-08 NOTE — ED Provider Notes (Signed)
[facility] Emergency Department Provider Note  ____________________________________________   First MD Initiated Contact with Patient 02/08/19 1037     (approximate)  I have reviewed the triage vital signs and the nursing notes.   HISTORY  Chief Complaint Well Child   HPI Derek Gilmore is a 23 y.o. male presents to the ED for a note to return to work.  Patient states that he had a Covid test done which was reported negative on "my chart".  He states his employer will not take this information as valid unless he has a note releasing him to go to work.  He denies any symptoms and has had no fever.  He denies any pain.     History reviewed. No pertinent past medical history.  There are no active problems to display for this patient.   History reviewed. No pertinent surgical history.  Prior to Admission medications   Not on File    Allergies Patient has no known allergies.  No family history on file.  Social History Social History   Tobacco Use  . Smoking status: Never Smoker  . Smokeless tobacco: Never Used  Substance Use Topics  . Alcohol use: No  . Drug use: No    Review of Systems Constitutional: No fever/chills Eyes: No visual changes. ENT: No sore throat. Cardiovascular: Denies chest pain. Respiratory: Denies shortness of breath. Gastrointestinal: No abdominal pain.  No nausea, no vomiting.  No diarrhea.  Genitourinary: Negative for dysuria. Musculoskeletal: Negative for muscle aches. Skin: Negative for rash. Neurological: Negative for headaches, focal weakness or numbness. ____________________________________________   PHYSICAL EXAM:  VITAL SIGNS: ED Triage Vitals  Enc Vitals Group     BP 02/08/19 1000 (!) 148/88     Pulse Rate 02/08/19 1000 75     Resp 02/08/19 1000 16     Temp 02/08/19 1000 98.6 F (37 C)     Temp Source 02/08/19 1000 Oral     SpO2 02/08/19 1000 98 %     Weight --      Height --      Head  Circumference --      Peak Flow --      Pain Score 02/08/19 1016 0     Pain Loc --      Pain Edu? --      Excl. in GC? --    Constitutional: Alert and oriented. Well appearing and in no acute distress. Eyes: Conjunctivae are normal.  Head: Atraumatic. Neck: No stridor.   Cardiovascular: Normal rate, regular rhythm. Grossly normal heart sounds.  Good peripheral circulation. Respiratory: Normal respiratory effort.  No retractions. Lungs CTAB. Musculoskeletal: No lower extremity tenderness nor edema.  No joint effusions. Neurologic:  Normal speech and language. No gross focal neurologic deficits are appreciated.  Skin:  Skin is warm, dry and intact. No rash noted. Psychiatric: Mood and affect are normal. Speech and behavior are normal.  ____________________________________________   LABS (all labs ordered are listed, but only abnormal results are displayed)  Labs Reviewed - No data to display   PROCEDURES  Procedure(s) performed (including Critical Care):  Procedures   ____________________________________________   INITIAL IMPRESSION / ASSESSMENT AND PLAN / ED COURSE  As part of my medical decision making, I reviewed the following data within the electronic MEDICAL RECORD NUMBER Notes from prior ED visits and [place] Controlled Substance Database   23 year old male presents to the ED for a return to work note.  Patient states he has no  symptoms.  He was seen and had a Covid test done on the 23rd.  This was reported as negative however he needs a note specifically saying that it is okay for him to return to work.  He has no symptoms, afebrile, and does have a negative Covid test.  Note was written for him to return to work tomorrow.  ____________________________________________   FINAL CLINICAL IMPRESSION(S) / ED DIAGNOSES  Final diagnoses:  Normal exam     ED Discharge Orders    None       Note:  This document was prepared using Dragon voice recognition software and may  include unintentional dictation errors.    Johnn Hai, PA-C 02/08/19 1157    Vanessa [place], MD 02/12/19 [phone]

## 2019-04-27 DIAGNOSIS — Z20822 Contact with and (suspected) exposure to covid-19: Secondary | ICD-10-CM | POA: Diagnosis not present

## 2019-06-04 ENCOUNTER — Ambulatory Visit: Payer: Self-pay

## 2019-06-04 ENCOUNTER — Other Ambulatory Visit: Payer: Self-pay

## 2019-06-04 DIAGNOSIS — Z0289 Encounter for other administrative examinations: Secondary | ICD-10-CM

## 2019-06-04 LAB — POCT URINALYSIS DIPSTICK
Bilirubin, UA: NEGATIVE
Blood, UA: NEGATIVE
Glucose, UA: NEGATIVE
Ketones, UA: NEGATIVE
Leukocytes, UA: NEGATIVE
Nitrite, UA: NEGATIVE
Protein, UA: NEGATIVE
Spec Grav, UA: 1.01 (ref 1.010–1.025)
Urobilinogen, UA: 0.2 E.U./dL
pH, UA: 6.5 (ref 5.0–8.0)

## 2019-06-04 NOTE — Progress Notes (Signed)
Presents for pre-employment drug screen. Specimen collected using [employer] Chain of Custody form for [facility] account number [id number]. Specimen ID [id number]

## 2019-06-07 LAB — CMP12+LP+TP+TSH+6AC+CBC/D/PLT
ALT: 12 IU/L (ref 0–44)
AST: 14 IU/L (ref 0–40)
Albumin/Globulin Ratio: 2.1 (ref 1.2–2.2)
Albumin: 5 g/dL (ref 4.1–5.2)
Alkaline Phosphatase: 63 IU/L (ref 39–117)
BUN/Creatinine Ratio: 9 (ref 9–20)
BUN: 10 mg/dL (ref 6–20)
Basophils Absolute: 0.1 10*3/uL (ref 0.0–0.2)
Basos: 1 %
Bilirubin Total: 0.3 mg/dL (ref 0.0–1.2)
Calcium: 9.7 mg/dL (ref 8.7–10.2)
Chloride: 102 mmol/L (ref 96–106)
Chol/HDL Ratio: 3.8 ratio (ref 0.0–5.0)
Cholesterol, Total: 192 mg/dL (ref 100–199)
Creatinine, Ser: 1.06 mg/dL (ref 0.76–1.27)
EOS (ABSOLUTE): 0.2 10*3/uL (ref 0.0–0.4)
Eos: 2 %
Estimated CHD Risk: 0.7 times avg. (ref 0.0–1.0)
Free Thyroxine Index: 2 (ref 1.2–4.9)
GFR calc Af Amer: 112 mL/min/{1.73_m2} (ref >59)
GFR calc non Af Amer: 97 mL/min/{1.73_m2} (ref >59)
GGT: 18 IU/L (ref 0–65)
Globulin, Total: 2.4 g/dL (ref 1.5–4.5)
Glucose: 84 mg/dL (ref 65–99)
HDL: 50 mg/dL (ref >39)
Hematocrit: 48.2 % (ref 37.5–51.0)
Hemoglobin: 16 g/dL (ref 13.0–17.7)
Immature Grans (Abs): 0 10*3/uL (ref 0.0–0.1)
Immature Granulocytes: 0 %
Iron: 86 ug/dL (ref 38–169)
LDH: 164 IU/L (ref 121–224)
LDL Chol Calc (NIH): 121 mg/dL — ABNORMAL HIGH (ref 0–99)
Lymphocytes Absolute: 4.1 10*3/uL — ABNORMAL HIGH (ref 0.7–3.1)
Lymphs: 40 %
MCH: 28.3 pg (ref 26.6–33.0)
MCHC: 33.2 g/dL (ref 31.5–35.7)
MCV: 85 fL (ref 79–97)
Monocytes Absolute: 0.6 10*3/uL (ref 0.1–0.9)
Monocytes: 6 %
Neutrophils Absolute: 5.2 10*3/uL (ref 1.4–7.0)
Neutrophils: 51 %
Phosphorus: 3.7 mg/dL (ref 2.8–4.1)
Platelets: 291 10*3/uL (ref 150–450)
Potassium: 4.6 mmol/L (ref 3.5–5.2)
RBC: 5.65 x10E6/uL (ref 4.14–5.80)
RDW: 12.2 % (ref 11.6–15.4)
Sodium: 139 mmol/L (ref 134–144)
T3 Uptake Ratio: 26 % (ref 24–39)
T4, Total: 7.6 ug/dL (ref 4.5–12.0)
TSH: 0.706 u[IU]/mL (ref 0.450–4.500)
Total Protein: 7.4 g/dL (ref 6.0–8.5)
Triglycerides: 120 mg/dL (ref 0–149)
Uric Acid: 6.9 mg/dL (ref 3.8–8.4)
VLDL Cholesterol Cal: 21 mg/dL (ref 5–40)
WBC: 10.2 10*3/uL (ref 3.4–10.8)

## 2019-06-07 LAB — QUANTIFERON-TB GOLD PLUS
QuantiFERON Mitogen Value: >10 IU/mL
QuantiFERON Nil Value: 0.05 IU/mL
QuantiFERON TB1 Ag Value: 0.05 IU/mL
QuantiFERON TB2 Ag Value: 0.05 IU/mL
QuantiFERON-TB Gold Plus: NEGATIVE

## 2019-06-07 LAB — HEPATITIS B SURFACE ANTIBODY,QUALITATIVE: Hep B Surface Ab, Qual: NONREACTIVE

## 2019-06-08 ENCOUNTER — Encounter: Payer: Self-pay | Admitting: Physician Assistant

## 2019-06-08 ENCOUNTER — Other Ambulatory Visit: Payer: Self-pay

## 2019-06-08 ENCOUNTER — Ambulatory Visit: Payer: Self-pay | Admitting: Physician Assistant

## 2019-06-08 VITALS — BP 126/84 | HR 72 | Temp 98.5°F | Resp 12 | Ht 69.0 in | Wt 238.0 lb

## 2019-06-08 DIAGNOSIS — J309 Allergic rhinitis, unspecified: Secondary | ICD-10-CM | POA: Insufficient documentation

## 2019-06-08 DIAGNOSIS — Z0289 Encounter for other administrative examinations: Secondary | ICD-10-CM

## 2019-06-08 DIAGNOSIS — Z0000 Encounter for general adult medical examination without abnormal findings: Secondary | ICD-10-CM

## 2019-06-08 DIAGNOSIS — K589 Irritable bowel syndrome without diarrhea: Secondary | ICD-10-CM | POA: Insufficient documentation

## 2019-06-08 DIAGNOSIS — J45909 Unspecified asthma, uncomplicated: Secondary | ICD-10-CM | POA: Insufficient documentation

## 2019-06-08 NOTE — Progress Notes (Signed)
States hasn't had any issues with Asthma or used inhaler since [employer].

## 2019-06-08 NOTE — Progress Notes (Signed)
   Subjective: Annual employment physical    Patient ID: Derek Gilmore, male    DOB: [birth year], 24 y.o.   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  Voices no concerns or complaints.   Review of Systems    Negative Objective:   Physical Exam No acute distress.  HEENT is unremarkable.  Neck is supple without adenopathy or bruits.  Lungs clear to auscultation.  Heart is regular rate and rhythm.  Abdomen is negative HSM, normoactive bowel sounds, soft and soft nontender to palpation.  No obvious deformity to the upper or lower extremities.  Upper and lower extremities have full equal range of motion.  No cervical or lumbar deformities.  Patient has full equal range of motion cervical lumbar spine.  Cranial nerves II through XII grossly intact.       Assessment & Plan: Well exam.  Discussed unremarkable lab results with patient.  Patient voiced no questions or concerns at this time.  Follow-up as necessary.

## 2019-09-30 DIAGNOSIS — Z6833 Body mass index (BMI) 33.0-33.9, adult: Secondary | ICD-10-CM | POA: Diagnosis not present

## 2019-09-30 DIAGNOSIS — E7800 Pure hypercholesterolemia, unspecified: Secondary | ICD-10-CM | POA: Diagnosis not present

## 2019-09-30 DIAGNOSIS — E6609 Other obesity due to excess calories: Secondary | ICD-10-CM | POA: Diagnosis not present

## 2019-09-30 DIAGNOSIS — Z79899 Other long term (current) drug therapy: Secondary | ICD-10-CM | POA: Diagnosis not present

## 2019-10-22 ENCOUNTER — Other Ambulatory Visit: Payer: Self-pay

## 2019-10-22 ENCOUNTER — Other Ambulatory Visit: Payer: 59

## 2019-10-22 NOTE — Progress Notes (Signed)
Lab [employer] ID: [id number]

## 2019-11-26 ENCOUNTER — Ambulatory Visit: Payer: Self-pay

## 2019-11-26 ENCOUNTER — Other Ambulatory Visit: Payer: Self-pay

## 2019-11-26 DIAGNOSIS — Z23 Encounter for immunization: Secondary | ICD-10-CM

## 2019-11-26 NOTE — Progress Notes (Signed)
Pt presented today for hep B injection due to being non-responsive. Will recheck labs in 6 weeks. CL,RMA

## 2019-12-09 DIAGNOSIS — U071 COVID-19: Secondary | ICD-10-CM | POA: Diagnosis not present

## 2019-12-09 DIAGNOSIS — Z20822 Contact with and (suspected) exposure to covid-19: Secondary | ICD-10-CM | POA: Diagnosis not present

## 2019-12-10 ENCOUNTER — Other Ambulatory Visit: Payer: Self-pay | Admitting: Internal Medicine

## 2019-12-10 DIAGNOSIS — J209 Acute bronchitis, unspecified: Secondary | ICD-10-CM | POA: Diagnosis not present

## 2019-12-10 DIAGNOSIS — J019 Acute sinusitis, unspecified: Secondary | ICD-10-CM | POA: Diagnosis not present

## 2019-12-10 DIAGNOSIS — U071 COVID-19: Secondary | ICD-10-CM | POA: Diagnosis not present

## 2019-12-10 DIAGNOSIS — Z03818 Encounter for observation for suspected exposure to other biological agents ruled out: Secondary | ICD-10-CM | POA: Diagnosis not present

## 2019-12-10 DIAGNOSIS — R197 Diarrhea, unspecified: Secondary | ICD-10-CM | POA: Diagnosis not present

## 2020-01-07 ENCOUNTER — Other Ambulatory Visit: Payer: Self-pay

## 2020-01-07 DIAGNOSIS — Z0184 Encounter for antibody response examination: Secondary | ICD-10-CM

## 2020-06-05 DIAGNOSIS — E7800 Pure hypercholesterolemia, unspecified: Secondary | ICD-10-CM | POA: Diagnosis not present

## 2020-06-05 DIAGNOSIS — J4520 Mild intermittent asthma, uncomplicated: Secondary | ICD-10-CM | POA: Diagnosis not present

## 2020-06-05 DIAGNOSIS — Z79899 Other long term (current) drug therapy: Secondary | ICD-10-CM | POA: Diagnosis not present

## 2020-06-05 DIAGNOSIS — E6609 Other obesity due to excess calories: Secondary | ICD-10-CM | POA: Diagnosis not present

## 2020-06-05 DIAGNOSIS — Z0000 Encounter for general adult medical examination without abnormal findings: Secondary | ICD-10-CM | POA: Diagnosis not present

## 2020-06-27 NOTE — Progress Notes (Signed)
Scheduled to complete physical 07/04/20 with Rhonda Summers, PA-C.  AMD 

## 2020-06-28 ENCOUNTER — Ambulatory Visit: Payer: Self-pay

## 2020-06-28 ENCOUNTER — Other Ambulatory Visit: Payer: Self-pay

## 2020-06-28 DIAGNOSIS — Z0000 Encounter for general adult medical examination without abnormal findings: Secondary | ICD-10-CM

## 2020-06-28 LAB — POCT URINALYSIS DIPSTICK
Bilirubin, UA: NEGATIVE
Blood, UA: NEGATIVE
Glucose, UA: NEGATIVE
Ketones, UA: NEGATIVE
Leukocytes, UA: NEGATIVE
Nitrite, UA: NEGATIVE
Protein, UA: NEGATIVE
Spec Grav, UA: 1.02 (ref 1.010–1.025)
Urobilinogen, UA: 0.2 E.U./dL
pH, UA: 6.5 (ref 5.0–8.0)

## 2020-06-29 LAB — CMP12+LP+TP+TSH+6AC+CBC/D/PLT
ALT: 87 IU/L — ABNORMAL HIGH (ref 0–44)
AST: 210 IU/L — ABNORMAL HIGH (ref 0–40)
Albumin/Globulin Ratio: 1.8 (ref 1.2–2.2)
Albumin: 4.6 g/dL (ref 4.1–5.2)
Alkaline Phosphatase: 63 IU/L (ref 44–121)
BUN/Creatinine Ratio: 13 (ref 9–20)
BUN: 14 mg/dL (ref 6–20)
Basophils Absolute: 0.1 10*3/uL (ref 0.0–0.2)
Basos: 1 %
Bilirubin Total: 0.4 mg/dL (ref 0.0–1.2)
Calcium: 9.6 mg/dL (ref 8.7–10.2)
Chloride: 97 mmol/L (ref 96–106)
Chol/HDL Ratio: 3.9 ratio (ref 0.0–5.0)
Cholesterol, Total: 223 mg/dL — ABNORMAL HIGH (ref 100–199)
Creatinine, Ser: 1.04 mg/dL (ref 0.76–1.27)
EOS (ABSOLUTE): 0.1 10*3/uL (ref 0.0–0.4)
Eos: 2 %
Estimated CHD Risk: 0.7 times avg. (ref 0.0–1.0)
Free Thyroxine Index: 2.2 (ref 1.2–4.9)
GGT: 21 IU/L (ref 0–65)
Globulin, Total: 2.5 g/dL (ref 1.5–4.5)
Glucose: 88 mg/dL (ref 65–99)
HDL: 57 mg/dL (ref >39)
Hematocrit: 49.9 % (ref 37.5–51.0)
Hemoglobin: 15.9 g/dL (ref 13.0–17.7)
Immature Grans (Abs): 0.3 10*3/uL — ABNORMAL HIGH (ref 0.0–0.1)
Immature Granulocytes: 5 %
Iron: 133 ug/dL (ref 38–169)
LDH: 464 IU/L — ABNORMAL HIGH (ref 121–224)
LDL Chol Calc (NIH): 132 mg/dL — ABNORMAL HIGH (ref 0–99)
Lymphocytes Absolute: 3 10*3/uL (ref 0.7–3.1)
Lymphs: 43 %
MCH: 27.6 pg (ref 26.6–33.0)
MCHC: 31.9 g/dL (ref 31.5–35.7)
MCV: 87 fL (ref 79–97)
Monocytes Absolute: 0.4 10*3/uL (ref 0.1–0.9)
Monocytes: 6 %
Neutrophils Absolute: 3 10*3/uL (ref 1.4–7.0)
Neutrophils: 43 %
Phosphorus: 2.7 mg/dL — ABNORMAL LOW (ref 2.8–4.1)
Platelets: 277 10*3/uL (ref 150–450)
Potassium: 4.4 mmol/L (ref 3.5–5.2)
RBC: 5.76 x10E6/uL (ref 4.14–5.80)
RDW: 11.9 % (ref 11.6–15.4)
Sodium: 138 mmol/L (ref 134–144)
T3 Uptake Ratio: 29 % (ref 24–39)
T4, Total: 7.6 ug/dL (ref 4.5–12.0)
TSH: 1.24 u[IU]/mL (ref 0.450–4.500)
Total Protein: 7.1 g/dL (ref 6.0–8.5)
Triglycerides: 192 mg/dL — ABNORMAL HIGH (ref 0–149)
Uric Acid: 6.5 mg/dL (ref 3.8–8.4)
VLDL Cholesterol Cal: 34 mg/dL (ref 5–40)
WBC: 7 10*3/uL (ref 3.4–10.8)
eGFR: 102 mL/min/{1.73_m2} (ref >59)

## 2020-07-04 ENCOUNTER — Other Ambulatory Visit: Payer: Self-pay

## 2020-07-04 ENCOUNTER — Ambulatory Visit: Payer: Self-pay | Admitting: Emergency Medicine

## 2020-07-04 ENCOUNTER — Encounter: Payer: Self-pay | Admitting: Emergency Medicine

## 2020-07-04 VITALS — BP 135/84 | HR 66 | Temp 97.7°F | Resp 14 | Wt 233.0 lb

## 2020-07-04 DIAGNOSIS — Z0000 Encounter for general adult medical examination without abnormal findings: Secondary | ICD-10-CM

## 2020-07-04 NOTE — Progress Notes (Signed)
Pt denies any issues or concerns at this time/CL,RMA ?

## 2020-07-04 NOTE — Progress Notes (Signed)
   I have reviewed the triage vital signs and the nursing notes.   HISTORY  Chief Complaint Annual Exam   HPI Derek Gilmore is a 25 y.o. male is here for annual physical.  He has no complaints.       History reviewed. No pertinent past medical history.  Patient Active Problem List   Diagnosis Date Noted  . Allergic rhinitis 06/08/2019  . Asthma 06/08/2019  . IBS (irritable bowel syndrome) 06/08/2019  . Morbid obesity with BMI of 40.0-44.9, adult (HCC) 01/07/2019  . Borderline hypertension 03/18/2016    Past Surgical History:  Procedure Laterality Date  . TONSILECTOMY, ADENOIDECTOMY, BILATERAL MYRINGOTOMY AND TUBES      Prior to Admission medications   Medication Sig Start Date End Date Taking? Authorizing Provider  phentermine (ADIPEX-P) 37.5 MG tablet  06/01/19  Yes [provider]  topiramate (TOPAMAX) 50 MG tablet Take 50 mg by mouth 2 (two) times daily. 04/20/19  Yes [provider]    Allergies Patient has no known allergies.  Family History  Problem Relation Age of Onset  . Prostate cancer Maternal Grandfather   . Thyroid cancer Maternal Grandfather     Social History Social History   Tobacco Use  . Smoking status: Never Smoker  . Smokeless tobacco: Current User    Types: Snuff  Substance Use Topics  . Alcohol use: No  . Drug use: No    Review of Systems Constitutional: No fever/chills Eyes: No visual changes. ENT: No complaints. Cardiovascular: Denies chest pain. Respiratory: Denies shortness of breath. Gastrointestinal: No abdominal pain.  No nausea, no vomiting.  Genitourinary: Negative for dysuria. Musculoskeletal: Negative for muscle pain or joint aches. Skin: Negative for rash. Neurological: Negative for headaches, focal weakness or numbness. ____________________________________________   PHYSICAL EXAM:  VITAL SIGNS: Constitutional: Alert and oriented. Well appearing and in no acute distress. Eyes: Conjunctivae  are normal. PERRL. EOMI. Head: Atraumatic. Nose: No congestion/rhinnorhea. Mouth/Throat: Mucous membranes are moist.  Oropharynx non-erythematous. Neck: No stridor.  No cervical tenderness on palpation posteriorly.  Range of motion without restriction. Cardiovascular: Normal rate, regular rhythm. Grossly normal heart sounds.  Good peripheral circulation. Respiratory: Normal respiratory effort.  No retractions. Lungs CTAB. Gastrointestinal: Soft and nontender. No distention.  Bowel sounds normoactive x4 quadrants. Musculoskeletal: No tenderness noted on palpation of thoracic or lumbar spine.  Able move upper and lower extremities without any difficulty.  Normal gait is noted.  Good muscle strength bilaterally. Neurologic:  Normal speech and language.  Reflexes 2+ bilateral lower extremities.  No gross focal neurologic deficits are appreciated. No gait instability. Skin:  Skin is warm, dry and intact. No rash noted. Psychiatric: Mood and affect are normal. Speech and behavior are normal.  ____________________________________________   LABS (all labs ordered are listed, but only abnormal results are displayed)  Discussed with patient. ____________________________________________  EKG Normal sinus rhythm with ventricular rate of 69.  ____________________________________________    FINAL CLINICAL IMPRESSION(S)  Normal physical exam. Patient is to be mindful of eating habits and also increase exercise.  We will expect to see better cholesterol and triglycerides results in the future.   ED Discharge Orders    None       Note:  This document was prepared using Dragon voice recognition software and may include unintentional dictation errors.

## 2020-08-03 DIAGNOSIS — R69 Illness, unspecified: Secondary | ICD-10-CM | POA: Diagnosis not present

## 2020-08-03 DIAGNOSIS — Z202 Contact with and (suspected) exposure to infections with a predominantly sexual mode of transmission: Secondary | ICD-10-CM | POA: Diagnosis not present
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
## 2021-01-03 ENCOUNTER — Ambulatory Visit: Payer: Self-pay

## 2021-01-03 ENCOUNTER — Other Ambulatory Visit: Payer: Self-pay

## 2021-01-03 DIAGNOSIS — Z0289 Encounter for other administrative examinations: Secondary | ICD-10-CM

## 2021-01-03 LAB — POCT URINALYSIS DIPSTICK
Bilirubin, UA: NEGATIVE
Blood, UA: NEGATIVE
Glucose, UA: NEGATIVE
Ketones, UA: NEGATIVE
Leukocytes, UA: NEGATIVE
Nitrite, UA: NEGATIVE
Protein, UA: POSITIVE — AB
Spec Grav, UA: 1.025 (ref 1.010–1.025)
Urobilinogen, UA: 0.2 E.U./dL
pH, UA: 6 (ref 5.0–8.0)

## 2021-01-04 LAB — CMP12+LP+TP+TSH+6AC+CBC/D/PLT
ALT: 301 IU/L — ABNORMAL HIGH (ref 0–44)
AST: 161 IU/L — ABNORMAL HIGH (ref 0–40)
Albumin/Globulin Ratio: 2.2 (ref 1.2–2.2)
Albumin: 5 g/dL (ref 4.1–5.2)
Alkaline Phosphatase: 56 IU/L (ref 44–121)
BUN/Creatinine Ratio: 12 (ref 9–20)
BUN: 13 mg/dL (ref 6–20)
Basophils Absolute: 0.1 10*3/uL (ref 0.0–0.2)
Basos: 1 %
Bilirubin Total: 0.6 mg/dL (ref 0.0–1.2)
Calcium: 9.9 mg/dL (ref 8.7–10.2)
Chloride: 98 mmol/L (ref 96–106)
Chol/HDL Ratio: 3.8 ratio (ref 0.0–5.0)
Cholesterol, Total: 251 mg/dL — ABNORMAL HIGH (ref 100–199)
Creatinine, Ser: 1.06 mg/dL (ref 0.76–1.27)
EOS (ABSOLUTE): 0.2 10*3/uL (ref 0.0–0.4)
Eos: 2 %
Estimated CHD Risk: 0.7 times avg. (ref 0.0–1.0)
Free Thyroxine Index: 2.1 (ref 1.2–4.9)
GGT: 40 IU/L (ref 0–65)
Globulin, Total: 2.3 g/dL (ref 1.5–4.5)
Glucose: 91 mg/dL (ref 70–99)
HDL: 66 mg/dL (ref >39)
Hematocrit: 50.2 % (ref 37.5–51.0)
Hemoglobin: 16.7 g/dL (ref 13.0–17.7)
Immature Grans (Abs): 0.1 10*3/uL (ref 0.0–0.1)
Immature Granulocytes: 1 %
Iron: 136 ug/dL (ref 38–169)
LDH: 289 IU/L — ABNORMAL HIGH (ref 121–224)
LDL Chol Calc (NIH): 148 mg/dL — ABNORMAL HIGH (ref 0–99)
Lymphocytes Absolute: 3.9 10*3/uL — ABNORMAL HIGH (ref 0.7–3.1)
Lymphs: 44 %
MCH: 28.7 pg (ref 26.6–33.0)
MCHC: 33.3 g/dL (ref 31.5–35.7)
MCV: 86 fL (ref 79–97)
Monocytes Absolute: 0.7 10*3/uL (ref 0.1–0.9)
Monocytes: 8 %
Neutrophils Absolute: 3.9 10*3/uL (ref 1.4–7.0)
Neutrophils: 44 %
Phosphorus: 3.5 mg/dL (ref 2.8–4.1)
Platelets: 271 10*3/uL (ref 150–450)
Potassium: 4.7 mmol/L (ref 3.5–5.2)
RBC: 5.81 x10E6/uL — ABNORMAL HIGH (ref 4.14–5.80)
RDW: 12.2 % (ref 11.6–15.4)
Sodium: 138 mmol/L (ref 134–144)
T3 Uptake Ratio: 27 % (ref 24–39)
T4, Total: 7.6 ug/dL (ref 4.5–12.0)
TSH: 1.25 u[IU]/mL (ref 0.450–4.500)
Total Protein: 7.3 g/dL (ref 6.0–8.5)
Triglycerides: 207 mg/dL — ABNORMAL HIGH (ref 0–149)
Uric Acid: 8.7 mg/dL — ABNORMAL HIGH (ref 3.8–8.4)
VLDL Cholesterol Cal: 37 mg/dL (ref 5–40)
WBC: 8.8 10*3/uL (ref 3.4–10.8)
eGFR: 99 mL/min/{1.73_m2} (ref >59)

## 2021-01-09 ENCOUNTER — Other Ambulatory Visit: Payer: Self-pay

## 2021-01-09 ENCOUNTER — Encounter: Payer: Self-pay | Admitting: Physician Assistant

## 2021-01-09 ENCOUNTER — Ambulatory Visit: Payer: Self-pay | Admitting: Physician Assistant

## 2021-01-09 VITALS — BP 124/77 | HR 73 | Temp 98.0°F | Resp 12 | Ht 69.0 in | Wt 251.0 lb

## 2021-01-09 DIAGNOSIS — Z0000 Encounter for general adult medical examination without abnormal findings: Secondary | ICD-10-CM

## 2021-01-09 MED ORDER — SIMVASTATIN 20 MG PO TABS: 20.0000 mg | ORAL_TABLET | Freq: Every day | ORAL | 3 refills | Status: DC

## 2021-01-09 NOTE — Progress Notes (Signed)
   Subjective: Annual firefighter exam    Patient ID: Derek Gilmore, male    DOB: [birth year], 25 y.o.   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  Patient requests refill prescription for phentermine.   Review of Systems Migraine headaches and obesity.    Objective:   Physical Exam No acute distress.  Temperature 98, pulse 73, respiration 12, BP is 124/77, and patient is 100% O2 sat on room air.  Patient weighs 2 and 51 pounds and BMI is 37.1.  No acute  HEENT is unremarkable.  Neck is supple without lymphadenopathy or bruits.  Lungs are clear to auscultation.  Heart is regular rate and rhythm.  EKG cells RSR in V1. Abdomen distended secondary to body habitus, normoactive bowel sounds, soft, and nontender to palpation. No obvious deformity to the upper or lower extremities.  Patient has full and equal range of motion of the upper and lower extremities. No obvious cervical or lumbar spine deformity.  Patient has full and equal range of motion of the cervical and lumbar spine. Cranial nerves II through XII are grossly intact.  DTR 2+ without clonus.    Assessment & Plan: Well exam.   Discussed lab results with patient showing increase liver enzymes which are much improved from exam 6 months ago.  Patient also has elevated cholesterol and 251 which is a increased from 223 6 months ago.  Patient triglycerides at 207 which is increased from from 192 6 months ago.  HDL is 66.  Discussed rationale for starting statins  with follow-up in 6 months.  Advised patient I would not refill prescription for phentermine secondary to due to documented weight gain from his last 2 physical exams while taking medication.  Patient may discuss restarting/refill medication by previous prescribing PCP.

## 2021-06-26 ENCOUNTER — Other Ambulatory Visit: Payer: Self-pay

## 2021-08-17 NOTE — Addendum Note (Signed)
Addended by: Malachy Moan F on: 08/17/2021 10:50 AM   Modules accepted: Orders

## 2021-08-17 NOTE — Addendum Note (Signed)
Addended by: Christianne Dolin F on: 08/17/2021 10:45 AM   Modules accepted: Orders

## 2022-01-16 ENCOUNTER — Ambulatory Visit: Payer: Self-pay

## 2022-01-16 DIAGNOSIS — Z0289 Encounter for other administrative examinations: Secondary | ICD-10-CM

## 2022-01-16 LAB — POCT URINALYSIS DIPSTICK
Bilirubin, UA: NEGATIVE
Blood, UA: NEGATIVE
Glucose, UA: NEGATIVE
Ketones, UA: NEGATIVE
Leukocytes, UA: NEGATIVE
Nitrite, UA: NEGATIVE
Protein, UA: NEGATIVE
Spec Grav, UA: 1.01 (ref 1.010–1.025)
Urobilinogen, UA: 0.2 E.U./dL
pH, UA: 6 (ref 5.0–8.0)

## 2022-01-16 NOTE — Progress Notes (Signed)
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

## 2022-01-17 LAB — CMP12+LP+TP+TSH+6AC+CBC/D/PLT
ALT: 28 IU/L (ref 0–44)
AST: 19 IU/L (ref 0–40)
Albumin/Globulin Ratio: 1.9 (ref 1.2–2.2)
Albumin: 4.8 g/dL (ref 4.3–5.2)
Alkaline Phosphatase: 64 IU/L (ref 44–121)
BUN/Creatinine Ratio: 12 (ref 9–20)
BUN: 12 mg/dL (ref 6–20)
Basophils Absolute: 0.1 10*3/uL (ref 0.0–0.2)
Basos: 1 %
Bilirubin Total: 0.5 mg/dL (ref 0.0–1.2)
Calcium: 9.9 mg/dL (ref 8.7–10.2)
Chloride: 99 mmol/L (ref 96–106)
Chol/HDL Ratio: 4 ratio (ref 0.0–5.0)
Cholesterol, Total: 226 mg/dL — ABNORMAL HIGH (ref 100–199)
Creatinine, Ser: 0.97 mg/dL (ref 0.76–1.27)
EOS (ABSOLUTE): 0.2 10*3/uL (ref 0.0–0.4)
Eos: 2 %
Estimated CHD Risk: 0.7 times avg. (ref 0.0–1.0)
Free Thyroxine Index: 2.2 (ref 1.2–4.9)
GGT: 24 IU/L (ref 0–65)
Globulin, Total: 2.5 g/dL (ref 1.5–4.5)
Glucose: 89 mg/dL (ref 70–99)
HDL: 57 mg/dL (ref >39)
Hematocrit: 47.1 % (ref 37.5–51.0)
Hemoglobin: 15.6 g/dL (ref 13.0–17.7)
Immature Grans (Abs): 0 10*3/uL (ref 0.0–0.1)
Immature Granulocytes: 0 %
Iron: 125 ug/dL (ref 38–169)
LDH: 216 IU/L (ref 121–224)
LDL Chol Calc (NIH): 124 mg/dL — ABNORMAL HIGH (ref 0–99)
Lymphocytes Absolute: 4 10*3/uL — ABNORMAL HIGH (ref 0.7–3.1)
Lymphs: 43 %
MCH: 27.5 pg (ref 26.6–33.0)
MCHC: 33.1 g/dL (ref 31.5–35.7)
MCV: 83 fL (ref 79–97)
Monocytes Absolute: 0.6 10*3/uL (ref 0.1–0.9)
Monocytes: 6 %
Neutrophils Absolute: 4.4 10*3/uL (ref 1.4–7.0)
Neutrophils: 48 %
Phosphorus: 2.9 mg/dL (ref 2.8–4.1)
Platelets: 314 10*3/uL (ref 150–450)
Potassium: 4.4 mmol/L (ref 3.5–5.2)
RBC: 5.68 x10E6/uL (ref 4.14–5.80)
RDW: 12.3 % (ref 11.6–15.4)
Sodium: 137 mmol/L (ref 134–144)
T3 Uptake Ratio: 27 % (ref 24–39)
T4, Total: 8.2 ug/dL (ref 4.5–12.0)
TSH: 1.71 u[IU]/mL (ref 0.450–4.500)
Total Protein: 7.3 g/dL (ref 6.0–8.5)
Triglycerides: 259 mg/dL — ABNORMAL HIGH (ref 0–149)
Uric Acid: 8.3 mg/dL (ref 3.8–8.4)
VLDL Cholesterol Cal: 45 mg/dL — ABNORMAL HIGH (ref 5–40)
WBC: 9.3 10*3/uL (ref 3.4–10.8)
eGFR: 109 mL/min/{1.73_m2} (ref >59)

## 2022-01-23 ENCOUNTER — Encounter: Payer: Self-pay | Admitting: Physician Assistant

## 2022-01-23 ENCOUNTER — Ambulatory Visit: Payer: Self-pay | Admitting: Physician Assistant

## 2022-01-23 VITALS — BP 134/80 | HR 80 | Temp 97.6°F | Resp 14 | Ht 68.0 in | Wt 280.0 lb

## 2022-01-23 DIAGNOSIS — Z0289 Encounter for other administrative examinations: Secondary | ICD-10-CM

## 2022-01-23 MED ORDER — ROSUVASTATIN CALCIUM 40 MG PO TABS: 40.0000 mg | ORAL_TABLET | Freq: Every day | ORAL | 3 refills | Status: DC

## 2022-01-23 NOTE — Progress Notes (Signed)
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 

## 2022-01-23 NOTE — Progress Notes (Signed)
[facility] clinic  ____________________________________________   None    (approximate)  I have reviewed the triage vital signs and the nursing notes.   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  Patient reports no concerns or complaints.        History reviewed. No pertinent past medical history.  Patient Active Problem List   Diagnosis Date Noted   Allergic rhinitis 06/08/2019   Asthma 06/08/2019   IBS (irritable bowel syndrome) 06/08/2019   Morbid obesity with BMI of 40.0-44.9, adult ([place]) 01/07/2019   Borderline hypertension 03/18/2016    Past Surgical History:  Procedure Laterality Date   TONSILECTOMY, ADENOIDECTOMY, BILATERAL MYRINGOTOMY AND TUBES      Prior to Admission medications   Medication Sig Start Date End Date Taking? Authorizing Provider  rosuvastatin (CRESTOR) 40 MG tablet Take 1 tablet (40 mg total) by mouth daily. 01/23/22  Yes Sable Feil, PA-C    Allergies Patient has no known allergies.  Family History  Problem Relation Age of Onset   Prostate cancer Maternal Grandfather    Thyroid cancer Maternal Grandfather     Social History Social History   Tobacco Use   Smoking status: Never   Smokeless tobacco: Current    Types: Snuff  Substance Use Topics   Alcohol use: No   Drug use: No    Review of Systems Constitutional: No fever/chills Eyes: No visual changes. ENT: No sore throat. Cardiovascular: Denies chest pain. Respiratory: Denies shortness of breath. Gastrointestinal: No abdominal pain.  No nausea, no vomiting.  No diarrhea.  No constipation. Genitourinary: Negative for dysuria. Musculoskeletal: Negative for back pain. Skin: Negative for rash. Neurological: Negative for headaches, focal weakness or numbness. Endocrine: Hyperlipidemia  ____________________________________________   PHYSICAL  EXAM:  VITAL SIGNS: BP is 134/80, pulse 80, respiration 14, temperature 97.6, and patient is 97% O2 sat on room air.  Patient weighs 280 pounds and BMI is 42.57. Constitutional: Alert and oriented. Well appearing and in no acute distress. Eyes: Conjunctivae are normal. PERRL. EOMI. Head: Atraumatic. Nose: No congestion/rhinnorhea. Mouth/Throat: Mucous membranes are moist.  Oropharynx non-erythematous. Neck: No stridor.  No cervical spine tenderness to palpation. Hematological/Lymphatic/Immunilogical: No cervical lymphadenopathy. Cardiovascular: Normal rate, regular rhythm. Grossly normal heart sounds.  Good peripheral circulation. Respiratory: Normal respiratory effort.  No retractions. Lungs CTAB. Gastrointestinal: Soft and nontender. No distention. No abdominal bruits. No CVA tenderness. Genitourinary: Deferred Musculoskeletal: No lower extremity tenderness nor edema.  No joint effusions. Neurologic:  Normal speech and language. No gross focal neurologic deficits are appreciated. No gait instability. Skin:  Skin is warm, dry and intact. No rash noted. Psychiatric: Mood and affect are normal. Speech and behavior are normal.  ____________________________________________   LABS         Component Ref Range & Units 7 d ago (01/16/22) 1 yr ago (01/03/21) 1 yr ago (06/28/20) 2 yr ago (06/04/19)  Color, UA  Transparent Light Amber Dark Yellow Light Yellow  Clarity, UA  Clear Clear Clear Clear  Glucose, UA _0   Bilirubin, UA  Negative Negative Negative Negative  Ketones, UA  Negative Negative Negative Negative  Spec Grav, UA 1.010 - 1.025 1.010 1.025 1.020 1.010  Blood, UA  Negative Negative Negative Negative  pH, UA 5.0 - 8.0 6.0 6.0 6.5 6.5  Protein, UA Negative Negative Positive Abnormal  CM Negative Negative  Urobilinogen, UA  0.2 or 1.0 E.U./dL 0.2 0.2 0.2 0.2  Nitrite, UA  Negative Negative Negative Negative  Leukocytes, UA Negative Negative  Negative Negative Negative  Appearance       Odor    Strong                        Component Ref Range & Units 7 d ago (01/16/22) 1 yr ago (01/03/21) 1 yr ago (06/28/20) 2 yr ago (06/04/19) 3 yr ago (12/14/18) 9 yr ago (07/21/12) 9 yr ago (07/21/12) 9 yr ago (07/21/12)  Glucose 70 - 99 mg/dL 89 91 88 R 84 R    112 High  R  Uric Acid 3.8 - 8.4 mg/dL 8.3 8.7 High  CM 6.5 CM 6.9 CM      Comment:            Therapeutic target for gout patients: <6.0  BUN 6 - 20 mg/dL _0 R  Creatinine, Ser 0.76 - 1.27 mg/dL 0.97 1.06 1.04 1.06    1.32 High  R  eGFR >59 mL/min/1.73 109 99 102       BUN/Creatinine Ratio 9 - _1 Sodium 134 - 144 mmol/L 137 138 138 139    137 R  Potassium 3.5 - 5.2 mmol/L 4.4 4.7 4.4 4.6    4.1 R  Chloride 96 - 106 mmol/L 99 98 97 102    102 R  Calcium 8.7 - 10.2 mg/dL 9.9 9.9 9.6 9.7    9.7 R  Phosphorus 2.8 - 4.1 mg/dL 2.9 3.5 2.7 Low  3.7      Total Protein 6.0 - 8.5 g/dL 7.3 7.3 7.1 7.4    8.4 R  Albumin 4.3 - 5.2 g/dL 4.8 5.0 R 4.6 R 5.0 R    4.6 R  Globulin, Total 1.5 - 4.5 g/dL 2.5 2.3 2.5 2.4      Albumin/Globulin Ratio 1.2 - 2.2 1.9 2.2 1.8 2.1      Bilirubin Total 0.0 - 1.2 mg/dL 0.5 0.6 0.4 0.3    0.4 R  Alkaline Phosphatase 44 - 121 IU/L 64 56 63 63 R    90 Low  R  LDH 121 - 224 IU/L 216 289 High  464 High  164      AST 0 - 40 IU/L 19 161 High  210 High  14    18 R  ALT 0 - 44 IU/L 28 301 High  87 High  12    27 R  GGT 0 - 65 IU/L 24 40 21 18      Iron 38 - 169 ug/dL 125 136 133 86      Cholesterol, Total 100 - 199 mg/dL 226 High  251 High  223 High  192      Triglycerides 0 - 149 mg/dL 259 High  207 High  192 High  120      HDL >39 mg/dL 57 66 57 50 41 R     VLDL Cholesterol Cal 5 - 40 mg/dL 45 High  37 34 21      LDL Chol Calc (NIH) 0 - 99 mg/dL 124 High  148 High  132 High  121 High       Chol/HDL Ratio 0.0 - 5.0 ratio 4.0 3.8 CM 3.9 CM 3.8 CM      Comment:  T. Chol/HDL Ratio                                              Men  Women                               1/2 Avg.Risk  3.4    3.3                                   Avg.Risk  5.0    4.4                                2X Avg.Risk  9.6    7.1                                3X Avg.Risk 23.4   11.0  Estimated CHD Risk 0.0 - 1.0 times avg. 0.7 0.7 CM 0.7 CM 0.7 CM      Comment: The CHD Risk is based on the T. Chol/HDL ratio. Other factors affect CHD Risk such as hypertension, smoking, diabetes, severe obesity, and family history of premature CHD.  TSH 0.450 - 4.500 uIU/mL 1.710 1.250 1.240 0.706      T4, Total 4.5 - 12.0 ug/dL 8.2 7.6 7.6 7.6      T3 Uptake Ratio 24 - 39 % _0 Free Thyroxine Index 1.2 - 4.9 2.2 2.1 2.2 2.0      WBC 3.4 - 10.8 x10E3/uL 9.3 8.8 7.0 10.2   14.1 High  R   RBC 4.14 - 5.80 x10E6/uL 5.68 5.81 High  5.76 5.65   6.10 High  R   Hemoglobin 13.0 - 17.7 g/dL 15.6 16.7 15.9 16.0      Hematocrit 37.5 - 51.0 % 47.1 50.2 49.9 48.2      MCV 79 - 97 fL 83 86 87 85   82 R   MCH 26.6 - 33.0 pg 27.5 28.7 27.6 28.3   27.9 R   MCHC 31.5 - 35.7 g/dL 33.1 33.3 31.9 33.2   33.9 R   RDW 11.6 - 15.4 % 12.3 12.2 11.9 12.2   13.0 R   Platelets 150 - 450 x10E3/uL 314 271 277 291   233 R   Neutrophils Not Estab. % 48 44 43 51  86.8 R    Lymphs Not Estab. % 43 44 43 40      Monocytes Not Estab. % _1 Eos Not Estab. % _2 Basos Not Estab. % _3 Neutrophils Absolute 1.4 - 7.0 x10E3/uL 4.4 3.9 3.0 5.2  12.3 High  R    Lymphocytes Absolute 0.7 - 3.1 x10E3/uL 4.0 High  3.9 High  3.0 4.1 High   0.7 Low  R    Monocytes Absolute 0.1 - 0.9 x10E3/uL 0.6 0.7 0.4 0.6      EOS (ABSOLUTE) 0.0 - 0.4 x10E3/uL 0.2 0.2 0.1 0.2      Basophils Absolute 0.0 - 0.2 x10E3/uL  0.1 0.1 0.1 0.1  0.1 R    Immature Granulocytes Not Estab. % 0 1 5 0                      ____________________________________________  EKG  Sinus rhythm at 73  bpm ____________________________________________    ____________________________________________   INITIAL IMPRESSION / ASSESSMENT AND PLAN   As part of my medical decision making, I reviewed the following data within the [place]      Discussed EKG and lab results with patient.  Patient amenable to starting Crestor for hyperlipidemia.  Patient will follow-up in 3 months for fasting lipid profile.        ____________________________________________   FINAL CLINICAL IMPRESSION Well exam   ED Discharge Orders          Ordered    rosuvastatin (CRESTOR) 40 MG tablet  Daily        01/23/22 0936             Note:  This document was prepared using Dragon voice recognition software and may include unintentional dictation errors.

## 2022-06-12 ENCOUNTER — Other Ambulatory Visit: Payer: Self-pay

## 2023-01-23 ENCOUNTER — Ambulatory Visit: Payer: Self-pay

## 2023-01-23 DIAGNOSIS — Z0289 Encounter for other administrative examinations: Secondary | ICD-10-CM

## 2023-01-23 LAB — POCT URINALYSIS DIPSTICK
Bilirubin, UA: NEGATIVE
Blood, UA: NEGATIVE
Glucose, UA: NEGATIVE
Ketones, UA: NEGATIVE
Leukocytes, UA: NEGATIVE
Nitrite, UA: NEGATIVE
Protein, UA: NEGATIVE
Spec Grav, UA: <=1.005 — AB (ref 1.010–1.025)
Urobilinogen, UA: 0.2 U/dL
pH, UA: 6 (ref 5.0–8.0)

## 2023-01-23 NOTE — Addendum Note (Signed)
Addended by: Christianne Dolin F on: 01/23/2023 12:53 PM   Modules accepted: Orders

## 2023-01-24 LAB — CMP12+LP+TP+TSH+6AC+CBC/D/PLT
ALT: 27 [IU]/L (ref 0–44)
AST: 23 [IU]/L (ref 0–40)
Albumin: 4.8 g/dL (ref 4.3–5.2)
Alkaline Phosphatase: 71 [IU]/L (ref 44–121)
BUN/Creatinine Ratio: 12 (ref 9–20)
BUN: 11 mg/dL (ref 6–20)
Basophils Absolute: 0.1 10*3/uL (ref 0.0–0.2)
Basos: 1 %
Bilirubin Total: 0.3 mg/dL (ref 0.0–1.2)
Calcium: 9.9 mg/dL (ref 8.7–10.2)
Chloride: 97 mmol/L (ref 96–106)
Chol/HDL Ratio: 5.9 ratio — ABNORMAL HIGH (ref 0.0–5.0)
Cholesterol, Total: 234 mg/dL — ABNORMAL HIGH (ref 100–199)
Creatinine, Ser: 0.9 mg/dL (ref 0.76–1.27)
EOS (ABSOLUTE): 0.1 10*3/uL (ref 0.0–0.4)
Eos: 1 %
Estimated CHD Risk: 1.2 times avg. — ABNORMAL HIGH (ref 0.0–1.0)
Free Thyroxine Index: 2.2 (ref 1.2–4.9)
GGT: 26 [IU]/L (ref 0–65)
Globulin, Total: 2.6 g/dL (ref 1.5–4.5)
Glucose: 89 mg/dL (ref 70–99)
HDL: 40 mg/dL (ref >39)
Hematocrit: 47.1 % (ref 37.5–51.0)
Hemoglobin: 15.4 g/dL (ref 13.0–17.7)
Immature Grans (Abs): 0 10*3/uL (ref 0.0–0.1)
Immature Granulocytes: 0 %
Iron: 106 ug/dL (ref 38–169)
LDH: 212 [IU]/L (ref 121–224)
LDL Chol Calc (NIH): 129 mg/dL — ABNORMAL HIGH (ref 0–99)
Lymphocytes Absolute: 3.6 10*3/uL — ABNORMAL HIGH (ref 0.7–3.1)
Lymphs: 43 %
MCH: 27.7 pg (ref 26.6–33.0)
MCHC: 32.7 g/dL (ref 31.5–35.7)
MCV: 85 fL (ref 79–97)
Monocytes Absolute: 0.6 10*3/uL (ref 0.1–0.9)
Monocytes: 7 %
Neutrophils Absolute: 3.9 10*3/uL (ref 1.4–7.0)
Neutrophils: 48 %
Phosphorus: 3.3 mg/dL (ref 2.8–4.1)
Platelets: 298 10*3/uL (ref 150–450)
Potassium: 4.5 mmol/L (ref 3.5–5.2)
RBC: 5.55 x10E6/uL (ref 4.14–5.80)
RDW: 12.3 % (ref 11.6–15.4)
Sodium: 138 mmol/L (ref 134–144)
T3 Uptake Ratio: 27 % (ref 24–39)
T4, Total: 8.2 ug/dL (ref 4.5–12.0)
TSH: 1.71 u[IU]/mL (ref 0.450–4.500)
Total Protein: 7.4 g/dL (ref 6.0–8.5)
Triglycerides: 363 mg/dL — ABNORMAL HIGH (ref 0–149)
Uric Acid: 7.4 mg/dL (ref 3.8–8.4)
VLDL Cholesterol Cal: 65 mg/dL — ABNORMAL HIGH (ref 5–40)
WBC: 8.3 10*3/uL (ref 3.4–10.8)
eGFR: 119 mL/min/{1.73_m2} (ref >59)

## 2023-01-29 ENCOUNTER — Encounter: Payer: Self-pay | Admitting: Physician Assistant

## 2023-01-29 ENCOUNTER — Ambulatory Visit: Payer: Self-pay | Admitting: Physician Assistant

## 2023-01-29 VITALS — BP 144/90 | HR 75 | Temp 97.8°F | Resp 14 | Ht 68.0 in | Wt 299.0 lb

## 2023-01-29 DIAGNOSIS — Z0000 Encounter for general adult medical examination without abnormal findings: Secondary | ICD-10-CM

## 2023-01-29 NOTE — Progress Notes (Signed)
[facility] ____________________________________________   None    (approximate)  I have reviewed the triage vital signs and the nursing notes.   HISTORY  Chief Complaint No chief complaint on file.  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  Patient admits to noncompliance of statin for elevated triglycerides.  States that he has trouble reminding to take medication.  Advised on morning or nighttime routine to keep his medication bottle sink in order not to forget.          Patient Active Problem List   Diagnosis Date Noted   Allergic rhinitis 06/08/2019   Asthma 06/08/2019   IBS (irritable bowel syndrome) 06/08/2019   Morbid obesity with BMI of 40.0-44.9, adult (HCC) 01/07/2019   Borderline hypertension 03/18/2016    Past Surgical History:  Procedure Laterality Date   TONSILECTOMY, ADENOIDECTOMY, BILATERAL MYRINGOTOMY AND TUBES      Prior to Admission medications   Medication Sig Start Date End Date Taking? Authorizing Provider  rosuvastatin (CRESTOR) 40 MG tablet Take 1 tablet (40 mg total) by mouth daily. Patient not taking: Reported on 01/29/2023 01/23/22   Joni Reining, PA-C    Allergies Patient has no known allergies.  Family History  Problem Relation Age of Onset   Prostate cancer Maternal Grandfather    Thyroid cancer Maternal Grandfather     Social History Social History   Tobacco Use   Smoking status: Never   Smokeless tobacco: Current    Types: Snuff  Substance Use Topics   Alcohol use: No   Drug use: No    Review of Systems Constitutional: No fever/chills.  Morbid obesity Eyes: No visual changes. ENT: No sore throat. Cardiovascular: Denies chest pain. Respiratory: Denies shortness of breath. Gastrointestinal: No abdominal pain.  No nausea, no vomiting.  No diarrhea.  No constipation. Genitourinary: Negative for dysuria. Musculoskeletal: Negative for  back pain. Skin: Negative for rash. Neurological: Negative for headaches, focal weakness or numbness. Endocrine: Hyperlipidemia   ____________________________________________   PHYSICAL EXAM:  VITAL SIGNS: BP 144/90  Pulse 75  Resp 14  Temp 97.8 F (36.6 C)  SpO2 98 %  Weight 299 lb (135.6 kg)  Height 5\' 8"  (1.727 m)   BMI 45.46 kg/m2  BSA 2.55 m2   Constitutional: Alert and oriented. Well appearing and in no acute distress. Eyes: Conjunctivae are normal. PERRL. EOMI. Head: Atraumatic. Nose: No congestion/rhinnorhea. Mouth/Throat: Mucous membranes are moist.  Oropharynx non-erythematous. Neck: No stridor.  No cervical spine tenderness to palpation. Hematological/Lymphatic/Immunilogical: No cervical lymphadenopathy. Cardiovascular: Normal rate, regular rhythm. Grossly normal heart sounds.  Good peripheral circulation. Respiratory: Normal respiratory effort.  No retractions. Lungs CTAB. Gastrointestinal: Soft and nontender. No distention. No abdominal bruits. No CVA tenderness. Genitourinary: Deferred Musculoskeletal: No lower extremity tenderness nor edema.  No joint effusions. Neurologic:  Normal speech and language. No gross focal neurologic deficits are appreciated. No gait instability. Skin:  Skin is warm, dry and intact. No rash noted. Psychiatric: Mood and affect are normal. Speech and behavior are normal.  ____________________________________________   LABS ____________________________________________  EKG  Normal sinus rhythm at 77 bpm ____________________________________________    ____________________________________________   INITIAL IMPRESSION / ASSESSMENT AND PLAN / ED COURSE  As part of my medical decision making, I reviewed the following data within the electronic MEDICAL RECORD NUMBER        Not no acute findings on physical exam.  Discussed compliance of Crestor l for hyperlipidemia.  Patient will follow-up with lipid panel in 3  months.      ____________________________________________   FINAL CLINICAL IMPRESSION(S) / ED DIAGNOSES  @[facility] @   ED Discharge Orders     None        Note:  This document was prepared using Dragon voice recognition software and may include unintentional dictation errors.

## 2023-01-29 NOTE — Progress Notes (Signed)
Here for physical with [facility].  Expressing making active diet changes and increased activity to help with elevated lipid panel as expressed decline to use Crestor.

## 2023-05-05 ENCOUNTER — Other Ambulatory Visit: Payer: Self-pay

## 2023-05-05 DIAGNOSIS — E785 Hyperlipidemia, unspecified: Secondary | ICD-10-CM

## 2023-05-05 NOTE — Progress Notes (Signed)
 Pt completed lipid panel. Derek Gilmore

## 2023-05-06 LAB — LIPID PANEL
Chol/HDL Ratio: 3.8 ratio (ref 0.0–5.0)
Cholesterol, Total: 154 mg/dL (ref 100–199)
HDL: 41 mg/dL (ref >39)
LDL Chol Calc (NIH): 83 mg/dL (ref 0–99)
Triglycerides: 177 mg/dL — ABNORMAL HIGH (ref 0–149)
VLDL Cholesterol Cal: 30 mg/dL (ref 5–40)

## 2023-12-16 ENCOUNTER — Ambulatory Visit: Payer: Self-pay

## 2023-12-16 DIAGNOSIS — Z0289 Encounter for other administrative examinations: Secondary | ICD-10-CM

## 2023-12-16 LAB — POCT URINALYSIS DIPSTICK
Bilirubin, UA: NEGATIVE
Blood, UA: POSITIVE
Glucose, UA: NEGATIVE
Ketones, UA: NEGATIVE
Leukocytes, UA: NEGATIVE
Nitrite, UA: NEGATIVE
Protein, UA: NEGATIVE
Spec Grav, UA: 1.015 (ref 1.010–1.025)
Urobilinogen, UA: 0.2 U/dL
pH, UA: 6 (ref 5.0–8.0)

## 2023-12-17 LAB — CMP12+LP+TP+TSH+6AC+CBC/D/PLT
ALT: 21 IU/L (ref 0–44)
AST: 20 IU/L (ref 0–40)
Albumin: 4.8 g/dL (ref 4.3–5.2)
Alkaline Phosphatase: 61 IU/L (ref 47–123)
BUN/Creatinine Ratio: 13 (ref 9–20)
BUN: 13 mg/dL (ref 6–20)
Basophils Absolute: 0 x10E3/uL (ref 0.0–0.2)
Basos: 0 %
Bilirubin Total: 0.4 mg/dL (ref 0.0–1.2)
Calcium: 9.7 mg/dL (ref 8.7–10.2)
Chloride: 98 mmol/L (ref 96–106)
Chol/HDL Ratio: 5.9 ratio — ABNORMAL HIGH (ref 0.0–5.0)
Cholesterol, Total: 247 mg/dL — ABNORMAL HIGH (ref 100–199)
Creatinine, Ser: 0.98 mg/dL (ref 0.76–1.27)
EOS (ABSOLUTE): 0.2 x10E3/uL (ref 0.0–0.4)
Eos: 2 %
Estimated CHD Risk: 1.2 times avg. — ABNORMAL HIGH (ref 0.0–1.0)
Free Thyroxine Index: 2 (ref 1.2–4.9)
GGT: 25 IU/L (ref 0–65)
Globulin, Total: 2.6 g/dL (ref 1.5–4.5)
Glucose: 89 mg/dL (ref 70–99)
HDL: 42 mg/dL (ref >39)
Hematocrit: 47.1 % (ref 37.5–51.0)
Hemoglobin: 15.4 g/dL (ref 13.0–17.7)
Immature Grans (Abs): 0 x10E3/uL (ref 0.0–0.1)
Immature Granulocytes: 0 %
Iron: 107 ug/dL (ref 38–169)
LDH: 194 IU/L (ref 121–224)
LDL Chol Calc (NIH): 149 mg/dL — ABNORMAL HIGH (ref 0–99)
Lymphocytes Absolute: 3.7 x10E3/uL — ABNORMAL HIGH (ref 0.7–3.1)
Lymphs: 40 %
MCH: 28.4 pg (ref 26.6–33.0)
MCHC: 32.7 g/dL (ref 31.5–35.7)
MCV: 87 fL (ref 79–97)
Monocytes Absolute: 0.6 x10E3/uL (ref 0.1–0.9)
Monocytes: 7 %
Neutrophils Absolute: 4.6 x10E3/uL (ref 1.4–7.0)
Neutrophils: 51 %
Phosphorus: 2.9 mg/dL (ref 2.8–4.1)
Platelets: 331 x10E3/uL (ref 150–450)
Potassium: 4.1 mmol/L (ref 3.5–5.2)
RBC: 5.42 x10E6/uL (ref 4.14–5.80)
RDW: 12.3 % (ref 11.6–15.4)
Sodium: 136 mmol/L (ref 134–144)
T3 Uptake Ratio: 27 % (ref 24–39)
T4, Total: 7.4 ug/dL (ref 4.5–12.0)
TSH: 1.05 u[IU]/mL (ref 0.450–4.500)
Total Protein: 7.4 g/dL (ref 6.0–8.5)
Triglycerides: 302 mg/dL — ABNORMAL HIGH (ref 0–149)
Uric Acid: 8.2 mg/dL (ref 3.8–8.4)
VLDL Cholesterol Cal: 56 mg/dL — ABNORMAL HIGH (ref 5–40)
WBC: 9.1 x10E3/uL (ref 3.4–10.8)
eGFR: 107 mL/min/1.73 (ref >59)

## 2023-12-22 ENCOUNTER — Ambulatory Visit: Payer: Self-pay | Admitting: Physician Assistant

## 2023-12-22 ENCOUNTER — Encounter: Payer: Self-pay | Admitting: Physician Assistant

## 2023-12-22 VITALS — BP 143/82 | HR 77 | Temp 97.6°F | Resp 16 | Ht 69.0 in | Wt 282.0 lb

## 2023-12-22 DIAGNOSIS — E782 Mixed hyperlipidemia: Secondary | ICD-10-CM | POA: Insufficient documentation

## 2023-12-22 DIAGNOSIS — Z0000 Encounter for general adult medical examination without abnormal findings: Secondary | ICD-10-CM

## 2023-12-22 NOTE — Progress Notes (Signed)
 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 Derek Gilmore

## 2023-12-22 NOTE — Progress Notes (Signed)
 [facility]  ____________________________________________   None    (approximate)  I have reviewed the triage vital signs and the nursing notes.   HISTORY  Chief Complaint Employment Physical   HPI Derek Gilmore is a 28 y.o. 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  Patient voiced no complaints.  Patient continue to be noncompliant with statins for hyperlipidemia.          Patient Active Problem List   Diagnosis Date Noted   Allergic rhinitis 06/08/2019   Asthma 06/08/2019   IBS (irritable bowel syndrome) 06/08/2019   Morbid obesity with BMI of 40.0-44.9, adult (HCC) 01/07/2019   Borderline hypertension 03/18/2016    Past Surgical History:  Procedure Laterality Date   TONSILECTOMY, ADENOIDECTOMY, BILATERAL MYRINGOTOMY AND TUBES      Prior to Admission medications   Medication Sig Start Date End Date Taking? Authorizing Provider  rosuvastatin  (CRESTOR ) 40 MG tablet Take 1 tablet (40 mg total) by mouth daily. Patient not taking: Reported on 01/29/2023 01/23/22   Claudene Tanda POUR, PA-C    Allergies Patient has no known allergies.  Family History  Problem Relation Age of Onset   Prostate cancer Maternal Grandfather    Thyroid  cancer Maternal Grandfather     Social History Social History   Tobacco Use   Smoking status: Never   Smokeless tobacco: Current    Types: Snuff  Substance Use Topics   Alcohol use: No   Drug use: No    Review of Systems Constitutional: No fever/chills.  Obesity Eyes: No visual changes. ENT: No sore throat. Cardiovascular: Denies chest pain. Respiratory: Denies shortness of breath. Gastrointestinal: No abdominal pain.  No nausea, no vomiting.  No diarrhea.  No constipation. Genitourinary: Negative for dysuria. Musculoskeletal: Negative for back pain. Skin: Negative for rash. Neurological: Negative for headaches, focal weakness or  numbness. Endocrine: Hyperlipidemia   ____________________________________________   PHYSICAL EXAM:  VITAL SIGNS: BP 143/82  Cuff Size Large  Pulse Rate 77  Temp 97.6 F (36.4 C)  Weight 282 lb (127.9 kg)  Height 5' 9 (1.753 m)  Resp 16  SpO2 99 %   BMI: 41.64 kg/m2  BSA: 2.50 m2   Constitutional: Alert and oriented. Well appearing and in no acute distress. Eyes: Conjunctivae are normal. PERRL. EOMI. Head: Atraumatic. Nose: No congestion/rhinnorhea. Mouth/Throat: Mucous membranes are moist.  Oropharynx non-erythematous. Neck: No stridor.  No cervical spine tenderness to palpation. Hematological/Lymphatic/Immunilogical: No cervical lymphadenopathy. Cardiovascular: Normal rate, regular rhythm. Grossly normal heart sounds.  Good peripheral circulation. Respiratory: Normal respiratory effort.  No retractions. Lungs CTAB. Gastrointestinal: Soft and nontender. No distention. No abdominal bruits. No CVA tenderness. Genitourinary: Deferred Musculoskeletal: No lower extremity tenderness nor edema.  No joint effusions. Neurologic:  Normal speech and language. No gross focal neurologic deficits are appreciated. No gait instability. Skin:  Skin is warm, dry and intact. No rash noted. Psychiatric: Mood and affect are normal. Speech and behavior are normal.  ____________________________________________   LABS          Component Ref Range & Units (hover) 6 d ago 11 mo ago 1 yr ago 2 yr ago 3 yr ago 4 yr ago  Color, UA Yellow Pale Yellow Transparent Light Amber Dark Yellow Light Yellow  Clarity, UA Clear Clear Clear Clear Clear Clear  Glucose, UA Negative Negative Negative Negative Negative Negative  Bilirubin, UA Negative Negative Negative Negative Negative Negative  Ketones, UA Negative Negative Negative Negative Negative  Negative  Spec Grav, UA 1.015 <=1.005 Abnormal  1.010 1.025 1.020 1.010  Blood, UA Positive Negative Negative Negative Negative Negative  Comment: 1+  pH,  UA 6.0 6.0 6.0 6.0 6.5 6.5  Protein, UA Negative Negative Negative Positive Abnormal  CM Negative Negative  Urobilinogen, UA 0.2 0.2 0.2 0.2 0.2 0.2  Nitrite, UA Negative Negative Negative Negative Negative Negative  Leukocytes, UA Negative Negative Negative Negative Negative Negative  Appearance        Odor     Strong                Component Ref Range & Units (hover) 6 d ago 7 mo ago 11 mo ago 1 yr ago 2 yr ago 3 yr ago 4 yr ago  Glucose 89  89 89 91 88 R 84 R  Uric Acid 8.2  7.4 CM 8.3 CM 8.7 High  CM 6.5 CM 6.9 CM  Comment:            Therapeutic target for gout patients: <6.0  BUN 13  11 12 13 14 10   Creatinine, Ser 0.98  0.90 0.97 1.06 1.04 1.06  eGFR 107  119 109 99 102   BUN/Creatinine Ratio 13  12 12 12 13 9   Sodium 136  138 137 138 138 139  Potassium 4.1  4.5 4.4 4.7 4.4 4.6  Chloride 98  97 99 98 97 102  Calcium  9.7  9.9 9.9 9.9 9.6 9.7  Phosphorus 2.9  3.3 2.9 3.5 2.7 Low  3.7  Total Protein 7.4  7.4 7.3 7.3 7.1 7.4  Albumin 4.8  4.8 4.8 5.0 R 4.6 R 5.0 R  Globulin, Total 2.6  2.6 2.5 2.3 2.5 2.4  Bilirubin Total 0.4  0.3 0.5 0.6 0.4 0.3  Alkaline Phosphatase 61  71 R 64 R 56 R 63 R 63 R  LDH 194  212 216 289 High  464 High  164  AST 20  23 19  161 High  210 High  14  ALT 21  27 28  301 High  87 High  12  GGT 25  26 24  40 21 18  Iron 107  106 125 136 133 86  Cholesterol, Total 247 High  154 234 High  226 High  251 High  223 High  192  Triglycerides 302 High  177 High  363 High  259 High  207 High  192 High  120  HDL 42 41 40 57 66 57 50  VLDL Cholesterol Cal 56 High  30 65 High  45 High  37 34 21  LDL Chol Calc (NIH) 149 High  83 129 High  124 High  148 High  132 High  121 High   Chol/HDL Ratio 5.9 High  3.8 CM 5.9 High  CM 4.0 CM 3.8 CM 3.9 CM 3.8 CM  Comment:                                   T. Chol/HDL Ratio                                             Men  Women  1/2 Avg.Risk  3.4    3.3                                   Avg.Risk   5.0    4.4                                2X Avg.Risk  9.6    7.1                                3X Avg.Risk 23.4   11.0  Estimated CHD Risk 1.2 High   1.2 High  CM 0.7 CM 0.7 CM 0.7 CM 0.7 CM  Comment: The CHD Risk is based on the T. Chol/HDL ratio. Other factors affect CHD Risk such as hypertension, smoking, diabetes, severe obesity, and family history of premature CHD.  TSH 1.050  1.710 1.710 1.250 1.240 0.706  T4, Total 7.4  8.2 8.2 7.6 7.6 7.6  T3 Uptake Ratio 27  27 27 27 29 26   Free Thyroxine Index 2.0  2.2 2.2 2.1 2.2 2.0  WBC 9.1  8.3 9.3 8.8 7.0 10.2  RBC 5.42  5.55 5.68 5.81 High  5.76 5.65  Hemoglobin 15.4  15.4 15.6 16.7 15.9 16.0  Hematocrit 47.1  47.1 47.1 50.2 49.9 48.2  MCV 87  85 83 86 87 85  MCH 28.4  27.7 27.5 28.7 27.6 28.3  MCHC 32.7  32.7 33.1 33.3 31.9 33.2  RDW 12.3  12.3 12.3 12.2 11.9 12.2  Platelets 331  298 314 271 277 291  Neutrophils 51  48 48 44 43 51  Lymphs 40  43 43 44 43 40  Monocytes 7  7 6 8 6 6   Eos 2  1 2 2 2 2   Basos 0  1 1 1 1 1   Neutrophils Absolute 4.6  3.9 4.4 3.9 3.0 5.2  Lymphocytes Absolute 3.7 High   3.6 High  4.0 High  3.9 High  3.0 4.1 High   Monocytes Absolute 0.6  0.6 0.6 0.7 0.4 0.6  EOS (ABSOLUTE) 0.2  0.1 0.2 0.2 0.1 0.2  Basophils Absolute 0.0  0.1 0.1 0.1 0.1 0.1  Immature Granulocytes 0  0 0 1 5 0  Immature Grans (Abs) 0.0  0.0 0.0 0.1 0.3 High  CM 0.0                   ____________________________________________  EKG  Normal sinus rhythm at 77 bpm ____________________________________________    ____________________________________________   INITIAL IMPRESSION / ASSESSMENT AND PLAN  As part of my medical decision making, I reviewed the following data within the electronic MEDICAL RECORD NUMBER      No acute findings on physical exam and EKG.  Labs continue to show elevated lipid profile secondary to noncompliance of statins.  Patient will start Crestor  at 40 mg and follow-up in 3 months with fasting lipid  test.        ____________________________________________   FINAL CLINICAL IMPRESSION     ED Discharge Orders     None        Note:  This document was prepared using Dragon voice recognition software and may include unintentional dictation errors.

## 2023-12-23 ENCOUNTER — Other Ambulatory Visit: Payer: Self-pay

## 2023-12-23 DIAGNOSIS — E782 Mixed hyperlipidemia: Secondary | ICD-10-CM

## 2023-12-24 MED ORDER — ROSUVASTATIN CALCIUM 40 MG PO TABS: 40.0000 mg | ORAL_TABLET | Freq: Every day | ORAL | 3 refills | Status: AC

## 2023-12-31 ENCOUNTER — Ambulatory Visit: Payer: Self-pay

## 2023-12-31 DIAGNOSIS — H6123 Impacted cerumen, bilateral: Secondary | ICD-10-CM

## 2023-12-31 NOTE — Progress Notes (Signed)
 Pt presents today with bilateral cerumen impaction. I was able to clear wax from both ears and pt tolerated well, slightly irritated.

## 2024-03-24 ENCOUNTER — Other Ambulatory Visit: Payer: Self-pay

## 2024-03-24 DIAGNOSIS — E782 Mixed hyperlipidemia: Secondary | ICD-10-CM

## 2024-03-24 NOTE — Progress Notes (Signed)
 Pt presents today for labs and ear cleaning no need for lavage. Pt tolerated well.

## 2024-03-25 LAB — LIPID PANEL
Chol/HDL Ratio: 3.3 ratio (ref 0.0–5.0)
Cholesterol, Total: 132 mg/dL (ref 100–199)
HDL: 40 mg/dL
LDL Chol Calc (NIH): 70 mg/dL (ref 0–99)
Triglycerides: 119 mg/dL (ref 0–149)
VLDL Cholesterol Cal: 22 mg/dL (ref 5–40)
